# Patient Record
Sex: Male | Born: 1975 | Race: White | Hispanic: No | Marital: Married | Smoking: Current every day smoker
Health system: Southern US, Community
[De-identification: ages and names within clinical notes are randomized; demographics above are authoritative.]

## PROBLEM LIST (undated history)

## (undated) HISTORY — PX: CYST EXCISION: SHX5701

---

## 2010-08-13 ENCOUNTER — Emergency Department (HOSPITAL_COMMUNITY)
Admission: EM | Admit: 2010-08-13 | Discharge: 2010-08-13 | Disposition: A | Discharge status: Home / Self Care | Payer: Self-pay | Attending: Emergency Medicine | Admitting: Emergency Medicine

## 2010-08-13 ENCOUNTER — Emergency Department (HOSPITAL_COMMUNITY): Payer: Self-pay

## 2010-08-13 DIAGNOSIS — S61209A Unspecified open wound of unspecified finger without damage to nail, initial encounter: Secondary | ICD-10-CM | POA: Insufficient documentation

## 2010-08-13 DIAGNOSIS — Z23 Encounter for immunization: Secondary | ICD-10-CM | POA: Insufficient documentation

## 2010-08-13 DIAGNOSIS — W268XXA Contact with other sharp object(s), not elsewhere classified, initial encounter: Secondary | ICD-10-CM | POA: Insufficient documentation

## 2011-10-21 IMAGING — CR DG FINGER RING 2+V*L*
3 series · 3 of 3 positions shown · non-contrast
Comparison: None

CLINICAL DATA: Laceration left ring finger, bleeding, swelling

LEFT RING FINGER 2+V

[x finger pa left]
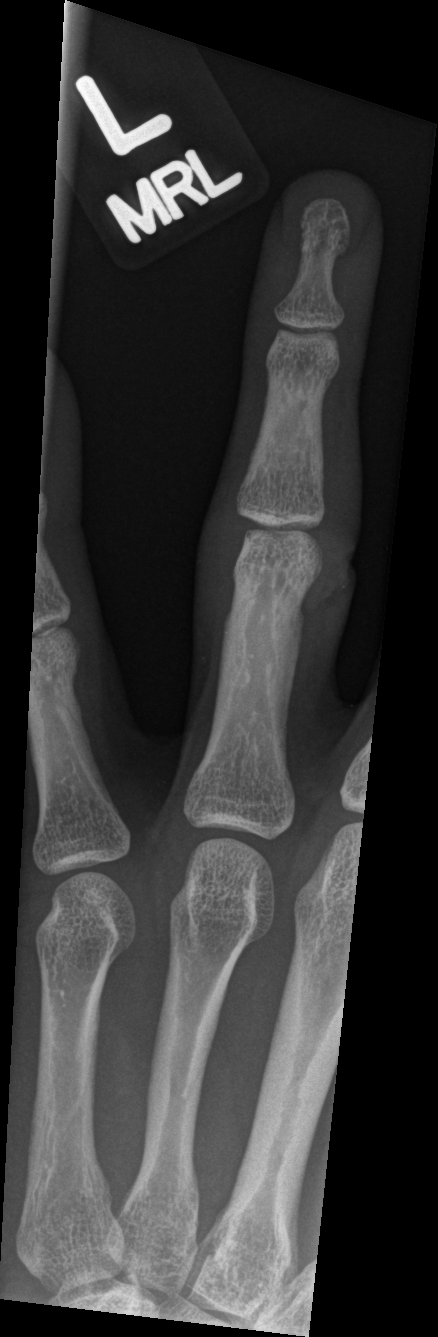

[x finger obl. left]
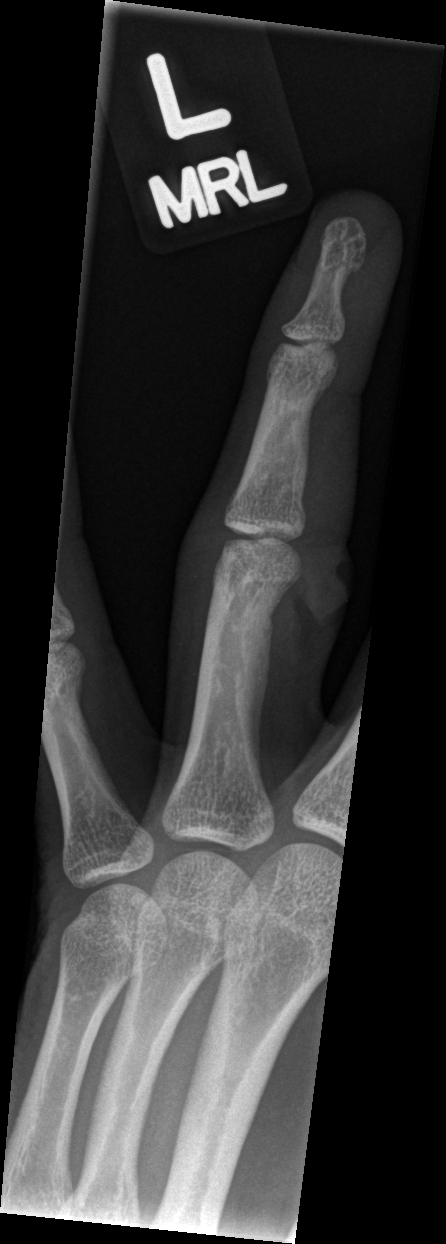

[x finger lateral left]
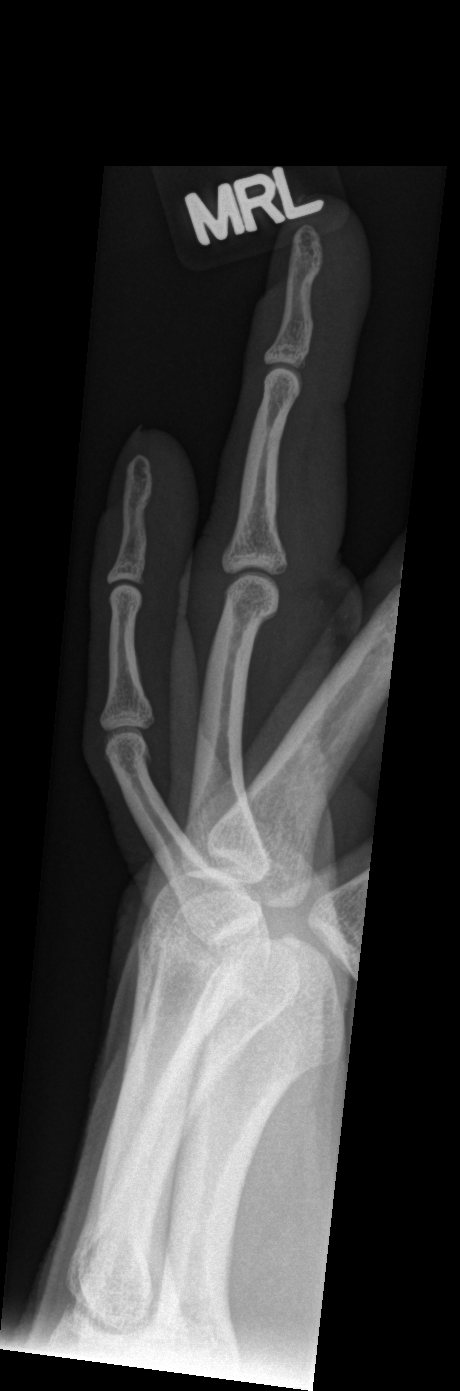

[3 of 3 positions shown; findings below may reference images not displayed]

FINDINGS: Soft tissue swelling and irregularity at radial aspect left ring
finger at the level of the head of the proximal phalanx.
Osseous mineralization normal.
Joint spaces preserved.
Tiny radiopacity at site of irregular soft tissue, question
radiopaque foreign body.
No acute fracture, dislocation or bone destruction.
IMPRESSION: No acute osseous abnormalities.
Questionable tiny radiopaque foreign body at site of laceration at
left ring finger.

## 2013-03-05 ENCOUNTER — Encounter (HOSPITAL_COMMUNITY): Payer: Self-pay | Admitting: Emergency Medicine

## 2013-03-05 ENCOUNTER — Emergency Department (HOSPITAL_COMMUNITY)
Admission: EM | Admit: 2013-03-05 | Discharge: 2013-03-05 | Disposition: A | Discharge status: Home / Self Care | Payer: Self-pay | Attending: Emergency Medicine | Admitting: Emergency Medicine

## 2013-03-05 DIAGNOSIS — Y9389 Activity, other specified: Secondary | ICD-10-CM | POA: Insufficient documentation

## 2013-03-05 DIAGNOSIS — W268XXA Contact with other sharp object(s), not elsewhere classified, initial encounter: Secondary | ICD-10-CM | POA: Insufficient documentation

## 2013-03-05 DIAGNOSIS — S61209A Unspecified open wound of unspecified finger without damage to nail, initial encounter: Secondary | ICD-10-CM | POA: Insufficient documentation

## 2013-03-05 DIAGNOSIS — Z23 Encounter for immunization: Secondary | ICD-10-CM | POA: Insufficient documentation

## 2013-03-05 DIAGNOSIS — Y9289 Other specified places as the place of occurrence of the external cause: Secondary | ICD-10-CM | POA: Insufficient documentation

## 2013-03-05 DIAGNOSIS — S61219A Laceration without foreign body of unspecified finger without damage to nail, initial encounter: Secondary | ICD-10-CM

## 2013-03-05 MED ORDER — TETANUS-DIPHTH-ACELL PERTUSSIS 5-2.5-18.5 LF-MCG/0.5 IM SUSP
0.5000 mL | Freq: Once | INTRAMUSCULAR | Status: AC
  Administered 2013-03-05: 0.5 mL via INTRAMUSCULAR
  Filled 2013-03-05: qty 0.5

## 2013-03-05 NOTE — ED Provider Notes (Signed)
CSN: [id number]     Arrival date & time 03/05/13  2106 History  This chart was scribed for non-physician practitioner, Kyung Bacca, PA-C working with Juliet Rude. Rubin Payor, MD by Greggory Stallion, ED scribe. This patient was seen in room WTR6/WTR6 and the patient's care was started at 9:25 PM.   Chief Complaint  Patient presents with  . Extremity Laceration   The history is provided by the patient. No language interpreter was used.   HPI Comments: Logan Little is a 37 y.o. male who presents to the Emergency Department complaining of left ring finger laceration that occurred about one hour ago. He states he cut his finger on a piece of glass on broken bottle in the garbage can. Denies associated finger weakness or numbness. Pt is unsure of his last tetanus.   History reviewed. No pertinent past medical history. History reviewed. No pertinent past surgical history. No family history on file. History  Substance Use Topics  . Smoking status: Not on file  . Smokeless tobacco: Not on file  . Alcohol Use: Not on file    Review of Systems  Skin: Positive for wound.  Neurological: Negative for weakness and numbness.  All other systems reviewed and are negative.    Allergies  Review of patient's allergies indicates not on file.  Home Medications  No current outpatient prescriptions on file.  BP 114/82  Pulse 99  Temp(Src) 98.3 F (36.8 C) (Oral)  Resp 16  SpO2 96%  Physical Exam  Nursing note and vitals reviewed. Constitutional: He is oriented to person, place, and time. He appears well-developed and well-nourished. No distress.  HENT:  Head: Normocephalic and atraumatic.  Eyes:  Normal appearance  Neck: Normal range of motion.  Pulmonary/Chest: Effort normal.  Musculoskeletal: Normal range of motion.  2cm vertical, bleeding, subq lac over dorsal surface of middle phalanx and over DIP of L ring finger.  ROM of DIP joint mildly limited by pain.  Brisk cap refill and  distal sensation intact.  Neurological: He is alert and oriented to person, place, and time.  Psychiatric: He has a normal mood and affect. His behavior is normal.    ED Course  Procedures (including critical care time)  DIAGNOSTIC STUDIES: Oxygen Saturation is 96% on RA, normal by my interpretation.    COORDINATION OF CARE: 9:28 PM-Discussed treatment plan which includes laceration repair with pt at bedside and pt agreed to plan.   LACERATION REPAIR Performed by: Otilio Miu Authorized by: Ruby Cola E Consent: Verbal consent obtained. Risks and benefits: risks, benefits and alternatives were discussed Consent given by: patient Patient identity confirmed: provided demographic data Prepped and Draped in normal sterile fashion Wound explored  Laceration Location: L ring finger  Laceration Length: 2.0 cm  No Foreign Bodies seen or palpated  Anesthesia: digital block Local anesthetic: lidocaine 2% w/out epinephrine  Anesthetic total: 5 ml  Irrigation method: syringe Amount of cleaning: standard  Skin closure: prolene 4.0  Number of sutures: 5  Technique: simple interrupted  Patient tolerance: Patient tolerated the procedure well with no immediate complications.   Labs Review Labs Reviewed - No data to display Imaging Review No results found.  EKG Interpretation   None       MDM   1. Laceration of finger, initial encounter    37yo healthy M presents w/ lac to L ring finger.  Cleaned and sutured.  Ortho tech placed in finger splint to prevent ROM of DIP joint. Tetanus updated.  Return  precautions discussed.    I personally performed the services described in this documentation, which was scribed in my presence. The recorded information has been reviewed and is accurate.   Otilio Miu, PA-C 03/06/13 0126

## 2013-03-05 NOTE — ED Notes (Signed)
Pt states he cut his ring finger on his L hand on a piece of glass on a garbage can. Bleeding controlled with pressure. Pt not sure when he last had a tetanus shot.

## 2013-03-07 NOTE — ED Provider Notes (Signed)
Medical screening examination/treatment/procedure(s) were performed by non-physician practitioner and as supervising physician I was immediately available for consultation/collaboration.  EKG Interpretation   None        Cleotilde Spadaccini R. Avedis Bevis, MD 03/07/13 2352 

## 2016-06-02 ENCOUNTER — Emergency Department
Admission: EM | Admit: 2016-06-02 | Discharge: 2016-06-02 | Disposition: A | Discharge status: Home / Self Care | Payer: Self-pay | Attending: Emergency Medicine | Admitting: Emergency Medicine

## 2016-06-02 ENCOUNTER — Emergency Department: Payer: Self-pay

## 2016-06-02 ENCOUNTER — Encounter: Payer: Self-pay | Admitting: Emergency Medicine

## 2016-06-02 DIAGNOSIS — R079 Chest pain, unspecified: Secondary | ICD-10-CM

## 2016-06-02 DIAGNOSIS — F17200 Nicotine dependence, unspecified, uncomplicated: Secondary | ICD-10-CM | POA: Insufficient documentation

## 2016-06-02 DIAGNOSIS — R0789 Other chest pain: Secondary | ICD-10-CM | POA: Insufficient documentation

## 2016-06-02 LAB — COMPREHENSIVE METABOLIC PANEL
ALT: 17 U/L (ref 17–63)
ANION GAP: 6 (ref 5–15)
AST: 18 U/L (ref 15–41)
Albumin: 4.3 g/dL (ref 3.5–5.0)
Alkaline Phosphatase: 52 U/L (ref 38–126)
BILIRUBIN TOTAL: 0.4 mg/dL (ref 0.3–1.2)
BUN: 15 mg/dL (ref 6–20)
CHLORIDE: 105 mmol/L (ref 101–111)
CO2: 26 mmol/L (ref 22–32)
Calcium: 8.8 mg/dL — ABNORMAL LOW (ref 8.9–10.3)
Creatinine, Ser: 0.87 mg/dL (ref 0.61–1.24)
Glucose, Bld: 106 mg/dL — ABNORMAL HIGH (ref 65–99)
POTASSIUM: 3.3 mmol/L — AB (ref 3.5–5.1)
Sodium: 137 mmol/L (ref 135–145)
TOTAL PROTEIN: 7 g/dL (ref 6.5–8.1)

## 2016-06-02 LAB — CBC
HEMATOCRIT: 44.1 % (ref 40.0–52.0)
Hemoglobin: 14.8 g/dL (ref 13.0–18.0)
MCH: 30.2 pg (ref 26.0–34.0)
MCHC: 33.6 g/dL (ref 32.0–36.0)
MCV: 89.9 fL (ref 80.0–100.0)
Platelets: 238 10*3/uL (ref 150–440)
RBC: 4.9 MIL/uL (ref 4.40–5.90)
RDW: 13.8 % (ref 11.5–14.5)
WBC: 8.6 10*3/uL (ref 3.8–10.6)

## 2016-06-02 LAB — LIPASE, BLOOD: LIPASE: 52 U/L — AB (ref 11–51)

## 2016-06-02 LAB — TROPONIN I: Troponin I: <0.03 ng/mL (ref <0.03)

## 2016-06-02 NOTE — ED Triage Notes (Signed)
Pt c/o LUQ pain present for 1 month. Denies NVD.  Pt supposed to go out of town tomorrow and wife wants to make sure he is ok before leaves.  Also c/o cyst to left rib area.  Denies CP/SHOB.  Denies fevers. When leans on a machine seems to make worse.

## 2016-06-02 NOTE — ED Notes (Signed)
Pt alert and oriented X4, active, cooperative, pt in NAD. RR even and unlabored, color WNL.  Pt informed to return if any life threatening symptoms occur.   

## 2016-06-02 NOTE — ED Provider Notes (Signed)
Time Seen: Approximately 1724*  I have reviewed the triage notes  Chief Complaint: Abdominal Pain   History of Present Illness: Logan Little is a 41 y.o. male who presents with some pain primarily in the left upper quadrant but states it's really in the rib cage on both sides. 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 He's had some surgically resected with pathology checks which did not show any signs of lymphoma etc. The patient's wife was concerned because her has been some non-Hodgkin's lymphoma in the family. He states the pains in his chest seems to be exacerbated by leaning against these lipomas. He denies any chest pain or shortness of breath.  History reviewed. No pertinent past medical history.  There are no active problems to display for this patient.   Past Surgical History:  Procedure Laterality Date  . CYST EXCISION      Past Surgical History:  Procedure Laterality Date  . CYST EXCISION        Allergies:  Patient has no known allergies.  Family History: History reviewed. No pertinent family history.  Social History: Social History  Substance Use Topics  . Smoking status: Current Every Day Smoker  . Smokeless tobacco: Never Used  . Alcohol use No     Review of Systems:   10 point review of systems was performed and was otherwise negative:  Constitutional: No fever Eyes: No visual disturbances ENT: No sore throat, ear pain Cardiac: No chest pain Respiratory: No shortness of breath, wheezing, or stridor Abdomen: Patient points mainly to the left upper quadrant over the left chest wall region. No nausea vomiting or diarrhea Endocrine: No weight loss, No night sweats Extremities: No peripheral edema, cyanosis Skin: No rashes, easy bruising Neurologic: No focal weakness, trouble with speech or swollowing Urologic: No dysuria, Hematuria, or urinary frequency   Physical Exam:  ED Triage Vitals  Enc Vitals Group     BP  06/02/16 1701 136/74     Pulse Rate 06/02/16 1701 78     Resp 06/02/16 1701 16     Temp 06/02/16 1701 98.6 F (37 C)     Temp Source 06/02/16 1701 Oral     SpO2 06/02/16 1701 98 %     Weight 06/02/16 1658 148 lb (67.1 kg)     Height 06/02/16 1658 5\' 7"  (1.702 m)     Head Circumference --      Peak Flow --      Pain Score 06/02/16 1658 5     Pain Loc --      Pain Edu? --      Excl. in GC? --     General: Awake , Alert , and Oriented times 3; GCS 15 Head: Normal cephalic , atraumatic Eyes: Pupils equal , round, reactive to light Nose/Throat: No nasal drainage, patent upper airway without erythema or exudate.  Neck: Supple, Full range of motion, No anterior adenopathy or palpable thyroid masses Lungs: Clear to ascultation without wheezes , rhonchi, or rales Heart: Regular rate, regular rhythm without murmurs , gallops , or rubs Abdomen: Soft, non tender without rebound, guarding , or rigidity; bowel sounds positive and symmetric in all 4 quadrants. No organomegaly .        Extremities: 2 plus symmetric pulses. No edema, clubbing or cyanosis Neurologic: normal ambulation, Motor symmetric without deficits, sensory intact Skin: warm, dry, no rashes Patient's tender mainly on the undersurface anteriorly of both rib cage regions.  I do not palpate any abnormal masses. There is a lipoma that somewhat tender across the lower left chest wall region approximately 2 cm in size without erythema etc.  Labs:   All laboratory work was reviewed including any pertinent negatives or positives listed below:  Labs Reviewed  LIPASE, BLOOD - Abnormal; Notable for the following:       Result Value   Lipase 52 (*)    All other components within normal limits  COMPREHENSIVE METABOLIC PANEL - Abnormal; Notable for the following:    Potassium 3.3 (*)    Glucose, Bld 106 (*)    Calcium 8.8 (*)    All other components within normal limits  CBC  TROPONIN I  URINALYSIS, COMPLETE (UACMP) WITH MICROSCOPIC     EKG:  ED ECG REPORT I, Jennye MoccasinBrian S Quigley, the attending physician, personally viewed and interpreted this ECG.  Date: 06/02/2016 EKG Time: 1705 Rate: 79 Rhythm: normal sinus rhythm QRS Axis: Left axis deviation Intervals: Incomplete left bundle branch block ST/T Wave abnormalities: normal Conduction Disturbances: none Narrative Interpretation: unremarkable No acute ischemic changes are noted  Radiology:  "Dg Chest 2 View  Result Date: 06/02/2016 CLINICAL DATA:  Smoker with left lower chest/ LUQ abd pain for about one month. Denies heart/lung disease EXAM: CHEST  2 VIEW COMPARISON:  None. FINDINGS: The heart size and mediastinal contours are within normal limits. Both lungs are clear. No pleural effusion or pneumothorax. The visualized skeletal structures are unremarkable. IMPRESSION: No active cardiopulmonary disease. Electronically Signed   By: Amie Portlandavid  Ormond M.D.   On: 06/02/2016 17:50  "  I personally reviewed the radiologic studies   ED Course: Given the patient's current clinical presentation and objective findings I felt this was unlikely to be a life-threatening cause for his discomfort especially based on his history. The main concerns seem to be the possibility of lymphoma. Advised them that the chest x-ray and exam does not indicate this and with his history of lipomas of felt it was unlikely but do recommend further outpatient follow-up. He states he does have a scheduled appointment with a surgeon for resection and evaluation of lipomas. He does have a bundle branch block on his EKG but certainly no acute ischemic changes and his history does not point to acute coronary syndrome nor other life-threatening causes for chest pain     Assessment: * Acute unspecified chest pain Chest wall discomfort  Final Clinical Impression:   Final diagnoses:  Chest pain, unspecified type     Plan:  Outpatient Patient was advised to return immediately if condition worsens.  Patient was advised to follow up with their primary care physician or other specialized physicians involved in their outpatient care. The patient and/or family member/power of attorney had laboratory results reviewed at the bedside. All questions and concerns were addressed and appropriate discharge instructions were distributed by the nursing staff. Jennye Moccasin*            Brian S Quigley, MD 06/02/16 2034

## 2016-06-02 NOTE — Discharge Instructions (Signed)
Please return immediately if condition worsens. Please contact her primary physician or the physician you were given for referral. If you have any specialist physicians involved in her treatment and plan please also contact them. Thank you for using [facility]. ° °

## 2017-08-10 IMAGING — CR DG CHEST 2V
2 series · 2 of 2 positions shown · non-contrast
Comparison: None.

CLINICAL DATA: Smoker with left lower chest/ LUQ abd pain for about
one month. Denies heart/lung disease

EXAM:
CHEST  2 VIEW

[chest pa]
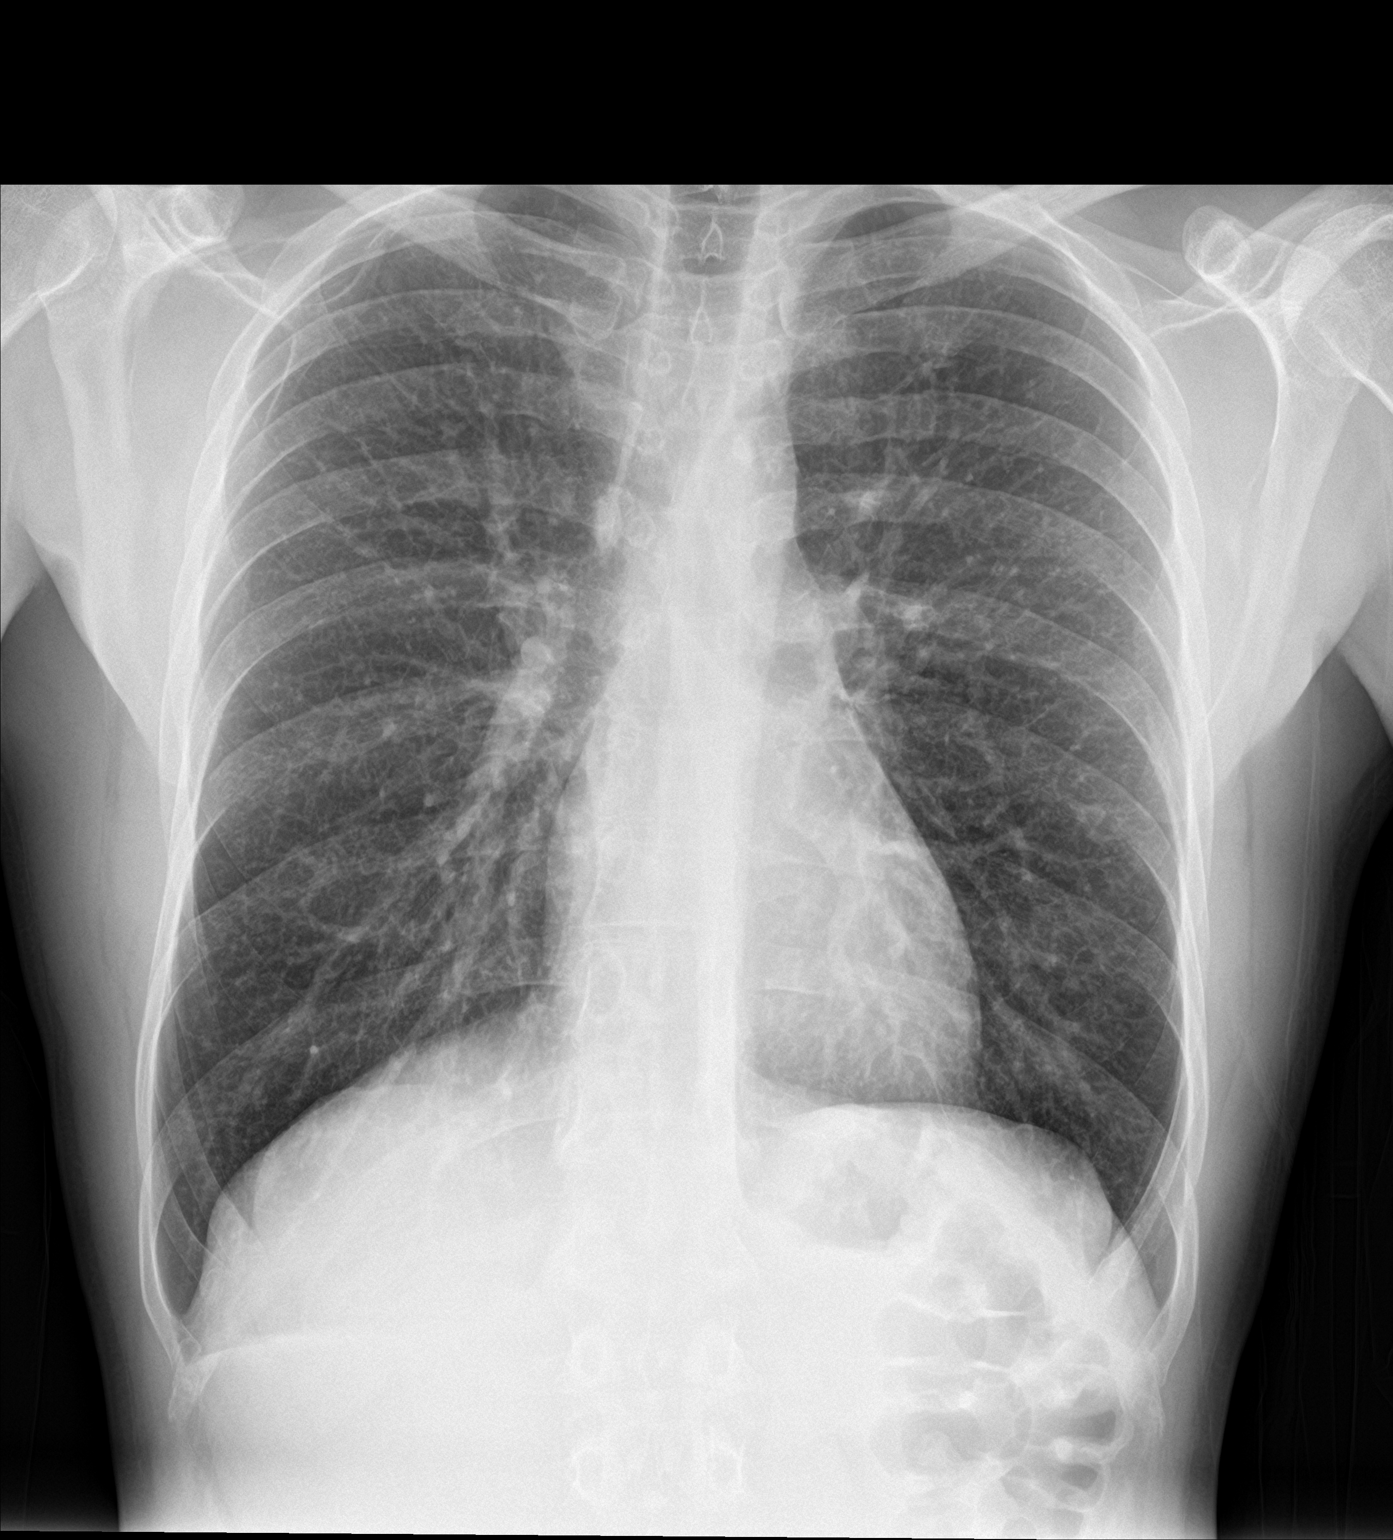

[chest lat]
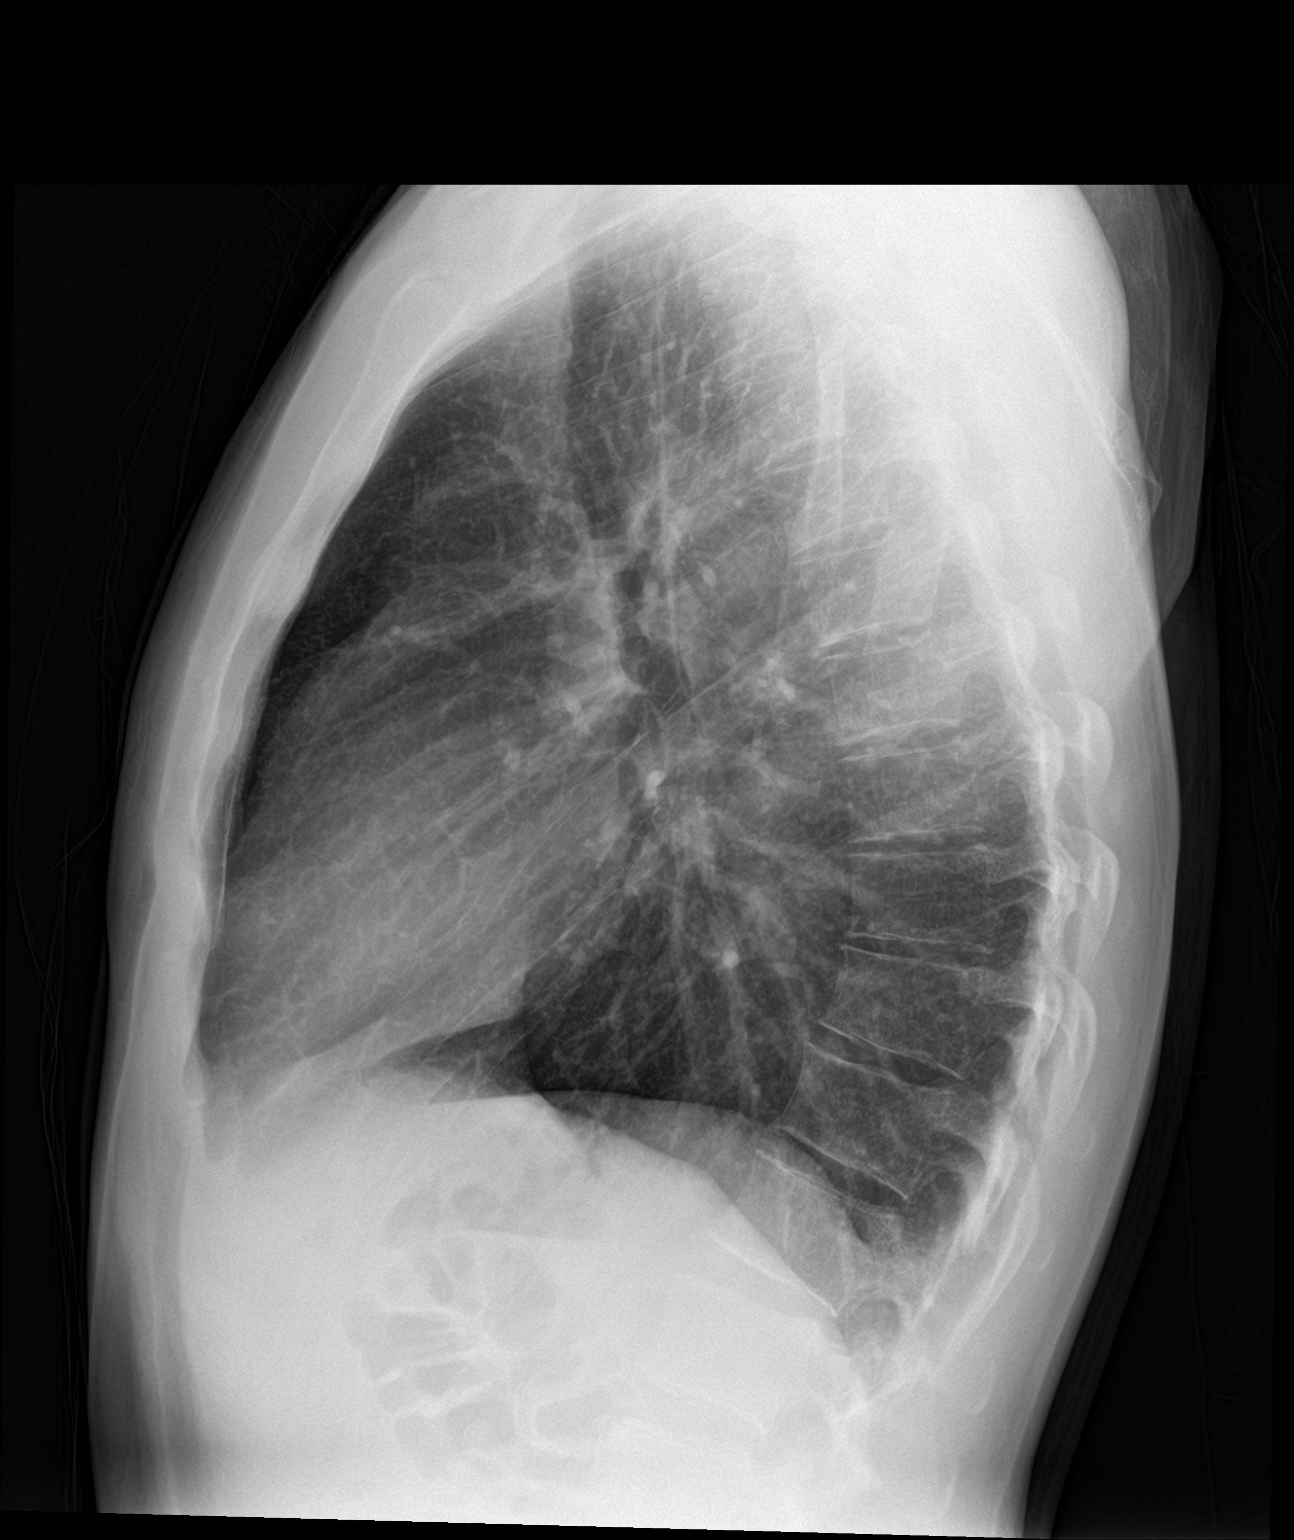

[2 of 2 positions shown; findings below may reference images not displayed]

FINDINGS: The heart size and mediastinal contours are within normal limits.
Both lungs are clear. No pleural effusion or pneumothorax. The
visualized skeletal structures are unremarkable.
IMPRESSION: No active cardiopulmonary disease.

## 2018-10-16 ENCOUNTER — Encounter: Payer: Self-pay | Admitting: Emergency Medicine

## 2018-10-16 ENCOUNTER — Other Ambulatory Visit: Payer: Self-pay

## 2018-10-16 ENCOUNTER — Ambulatory Visit
Admission: EM | Admit: 2018-10-16 | Discharge: 2018-10-16 | Disposition: A | Discharge status: Home / Self Care | Payer: Self-pay | Attending: Physician Assistant | Admitting: Physician Assistant

## 2018-10-16 DIAGNOSIS — R1013 Epigastric pain: Secondary | ICD-10-CM

## 2018-10-16 LAB — COMPREHENSIVE METABOLIC PANEL
ALT: 41 IU/L (ref 0–44)
AST: 22 IU/L (ref 0–40)
Albumin/Globulin Ratio: 2.2 (ref 1.2–2.2)
Albumin: 4.9 g/dL (ref 4.0–5.0)
Alkaline Phosphatase: 59 IU/L (ref 39–117)
BUN/Creatinine Ratio: 16 (ref 9–20)
BUN: 16 mg/dL (ref 6–24)
Bilirubin Total: 0.6 mg/dL (ref 0.0–1.2)
CO2: 29 mmol/L (ref 20–29)
Calcium: 11 mg/dL — ABNORMAL HIGH (ref 8.7–10.2)
Chloride: 102 mmol/L (ref 96–106)
Creatinine, Ser: 1.01 mg/dL (ref 0.76–1.27)
GFR calc Af Amer: 105 mL/min/{1.73_m2} (ref >59)
GFR calc non Af Amer: 91 mL/min/{1.73_m2} (ref >59)
Globulin, Total: 2.2 g/dL (ref 1.5–4.5)
Glucose: 107 mg/dL — ABNORMAL HIGH (ref 65–99)
Potassium: 4 mmol/L (ref 3.5–5.2)
Sodium: 136 mmol/L (ref 134–144)
Total Protein: 7.1 g/dL (ref 6.0–8.5)

## 2018-10-16 LAB — LIPASE: Lipase: 25 U/L (ref 13–78)

## 2018-10-16 MED ORDER — OMEPRAZOLE 20 MG PO CPDR: 20.0000 mg | DELAYED_RELEASE_CAPSULE | Freq: Every day | ORAL | 0 refills | Status: DC

## 2018-10-16 MED ORDER — ONDANSETRON 4 MG PO TBDP: 4.0000 mg | ORAL_TABLET | Freq: Three times a day (TID) | ORAL | 0 refills | Status: DC | PRN

## 2018-10-16 MED ORDER — ALUM & MAG HYDROXIDE-SIMETH 200-200-20 MG/5ML PO SUSP
30.0000 mL | Freq: Once | ORAL | Status: AC
  Administered 2018-10-16: 30 mL via ORAL

## 2018-10-16 MED ORDER — LIDOCAINE VISCOUS HCL 2 % MT SOLN
15.0000 mL | Freq: Once | OROMUCOSAL | Status: AC
  Administered 2018-10-16: 15:00:00 15 mL via ORAL

## 2018-10-16 NOTE — ED Triage Notes (Addendum)
Pt presents to [facility] for assessment of epigastric pain starting Tuesday after having dental surgery on Monday and placed on vicodin and amoxicillin.  Patient states starting Tuesday morning he began having epigastric pain that radiates up into his chest with nausea with attempts to eat.  Patient states he's been working through it.  Patient states it is a burning, twisting sensation.  Patient denies diarrhea, states he has had 1 BM since he started the meds, and it was normal.  Patient states he stopped taking both medications Wednesday because of the uncomfortableness.  Patient states he was taking a lot of ibuprofen and tylenol prior to his dental surgery.    Pt adds that he has a coworker who was exposed to someone who was tested for COVID.  States he believes the test was negative, but that he had concerning symptoms.  He has concerns for that being the cause.

## 2018-10-16 NOTE — ED Provider Notes (Signed)
[facility]    CSN: [id number] Arrival date & time: 10/16/18  1359     History   Chief Complaint Chief Complaint  Patient presents with  . Abdominal Pain    HPI Logan Little is a 43 y.o. male.   43 year old male comes in for 2-day history of epigastric pain.  Patient had dental surgery 3 days ago, and was put on Vicodin and amoxicillin.  He took 1-2 dose of each when he started having epigastric pain that is stabbing/cramping in sensation.  He has nausea with few episodes of belching causing burning sensation up the chest.  Had one episode of bowel movement without straining.  Has decreased appetite.  Has tolerated bland diet.  Denies fever, chills, night sweats.  Denies URI symptoms such as cough, congestion, sore throat.  Current everyday smoker.  Had taken ibuprofen/Tylenol every few hours prior to dental surgery, otherwise no chronic NSAID use.  No recent alcohol use.  No history of alcohol abuse.  Denies drug use.     History reviewed. No pertinent past medical history.  There are no active problems to display for this patient.   Past Surgical History:  Procedure Laterality Date  . CYST EXCISION       Home Medications    Prior to Admission medications   Medication Sig Start Date End Date Taking? Authorizing Provider  omeprazole (PRILOSEC) 20 MG capsule Take 1 capsule (20 mg total) by mouth daily. 10/16/18   Cathie HoopsYu, Mainor Hellmann V, PA-C  ondansetron (ZOFRAN ODT) 4 MG disintegrating tablet Take 1 tablet (4 mg total) by mouth every 8 (eight) hours as needed for nausea or vomiting. 10/16/18   Belinda FisherYu, Sakiya Stepka V, PA-C    Family History History reviewed. No pertinent family history.  Social History Social History   Tobacco Use  . Smoking status: Current Every Day Smoker    Packs/day: 1.00  . Smokeless tobacco: Never Used  Substance Use Topics  . Alcohol use: Yes    Comment: ocasionally  . Drug use: Yes    Types: Marijuana     Allergies   Patient has no known  allergies.   Review of Systems Review of Systems  Reason unable to perform ROS: See HPI as above.     Physical Exam Triage Vital Signs ED Triage Vitals  Enc Vitals Group     BP 10/16/18 1411 124/78     Pulse Rate 10/16/18 1411 76     Resp 10/16/18 1411 20     Temp 10/16/18 1411 99.1 F (37.3 C)     Temp Source 10/16/18 1411 Oral     SpO2 10/16/18 1411 98 %     Weight --      Height --      Head Circumference --      Peak Flow --      Pain Score 10/16/18 1421 6     Pain Loc --      Pain Edu? --      Excl. in GC? --    No data found.  Updated Vital Signs BP 124/78 (BP Location: Left Arm)   Pulse 76   Temp 99.1 F (37.3 C) (Oral)   Resp 20   SpO2 98%   Physical Exam Constitutional:      General: He is not in acute distress.    Appearance: He is well-developed. He is not ill-appearing, toxic-appearing or diaphoretic.  HENT:     Head: Normocephalic and atraumatic.  Cardiovascular:  Rate and Rhythm: Normal rate and regular rhythm.     Heart sounds: Normal heart sounds. No murmur. No friction rub. No gallop.   Pulmonary:     Effort: Pulmonary effort is normal.     Breath sounds: Normal breath sounds. No wheezing or rales.  Abdominal:     General: Bowel sounds are normal.     Palpations: Abdomen is soft.     Tenderness: There is abdominal tenderness in the epigastric area. There is no right CVA tenderness, left CVA tenderness, guarding or rebound. Negative signs include Murphy's sign.  Skin:    General: Skin is warm and dry.  Neurological:     Mental Status: He is alert and oriented to person, place, and time.  Psychiatric:        Behavior: Behavior normal.        Judgment: Judgment normal.    UC Treatments / Results  Labs (all labs ordered are listed, but only abnormal results are displayed) Labs Reviewed  COMPREHENSIVE METABOLIC PANEL - Abnormal; Notable for the following components:      Result Value   Glucose 107 (*)    Calcium 11.0 (*)    All  other components within normal limits   Narrative:    Performed at:  [facility], [place], [place]  [id number] Lab Director: Lindon Romp MD, Phone:  [phone]  LIPASE   Narrative:    Performed at:  [facility], [place], [place]  [id number] Lab Director: Lindon Romp MD, Phone:  [phone]    EKG   Radiology No results found.  Procedures Procedures (including critical care time)  Medications Ordered in UC Medications  alum & mag hydroxide-simeth (MAALOX/MYLANTA) 200-200-20 MG/5ML suspension 30 mL (30 mLs Oral Given 10/16/18 1454)    And  lidocaine (XYLOCAINE) 2 % viscous mouth solution 15 mL (15 mLs Oral Given 10/16/18 1454)    Initial Impression / Assessment and Plan / UC Course  I have reviewed the triage vital signs and the nursing notes.  Pertinent labs & imaging results that were available during my care of the patient were reviewed by me and considered in my medical decision making (see chart for details).    Will draw CMP and lipase for further evaluation.  GI cocktail given in office with mild relief.  Discussed possible gastritis caused by NSAIDs, antibiotic use.  Omeprazole and Zofran provided.  Push fluids.  Return precautions given.  Patient expresses understanding and agrees to plan.  Called patient about lab results, no significant changes. Will continue continue current plan and monitor symptoms. Again went through return precautions. Patient expresses understanding and agrees to plan.  Final Clinical Impressions(s) / UC Diagnoses   Final diagnoses:  Abdominal pain, epigastric    ED Prescriptions    Medication Sig Dispense Auth. Provider   omeprazole (PRILOSEC) 20 MG capsule Take 1 capsule (20 mg total) by mouth daily. 15 capsule Tanay Massiah V, PA-C   ondansetron (ZOFRAN ODT) 4 MG disintegrating tablet Take 1 tablet (4 mg total) by mouth every 8 (eight) hours as needed  for nausea or vomiting. 15 tablet Tobin Chad, [place] 10/16/18 1654

## 2018-10-16 NOTE — Discharge Instructions (Signed)
GI cocktail given in office. Labs drawn to check your liver and pancreatic enzyme. Omeprazole as needed for acid reflux. Zofran as needed for nausea/vomiting. Keep hydrated, urine should be clear to pale yellow in color. Bland diet, advance as tolerated. If experiencing worsening symptoms, unable to jump up and down due to pain, nausea/vomiting not controlled by medication, go to the ED for further evaluation needed.  As discussed, call your dentist to see if you still need to be on an antibiotic for the procedure.

## 2018-10-16 NOTE — ED Notes (Signed)
Patient able to ambulate independently  

## 2018-10-17 ENCOUNTER — Emergency Department
Admission: EM | Admit: 2018-10-17 | Discharge: 2018-10-17 | Disposition: A | Discharge status: Home / Self Care | Payer: Self-pay | Attending: Emergency Medicine | Admitting: Emergency Medicine

## 2018-10-17 ENCOUNTER — Encounter: Payer: Self-pay | Admitting: Emergency Medicine

## 2018-10-17 ENCOUNTER — Other Ambulatory Visit: Payer: Self-pay

## 2018-10-17 DIAGNOSIS — F17210 Nicotine dependence, cigarettes, uncomplicated: Secondary | ICD-10-CM | POA: Insufficient documentation

## 2018-10-17 DIAGNOSIS — K2970 Gastritis, unspecified, without bleeding: Secondary | ICD-10-CM | POA: Insufficient documentation

## 2018-10-17 DIAGNOSIS — R112 Nausea with vomiting, unspecified: Secondary | ICD-10-CM | POA: Insufficient documentation

## 2018-10-17 LAB — CBC
HCT: 48.1 % (ref 39.0–52.0)
Hemoglobin: 16.5 g/dL (ref 13.0–17.0)
MCH: 30.1 pg (ref 26.0–34.0)
MCHC: 34.3 g/dL (ref 30.0–36.0)
MCV: 87.8 fL (ref 80.0–100.0)
Platelets: 297 10*3/uL (ref 150–400)
RBC: 5.48 MIL/uL (ref 4.22–5.81)
RDW: 13.2 % (ref 11.5–15.5)
WBC: 10.2 10*3/uL (ref 4.0–10.5)
nRBC: 0 % (ref 0.0–0.2)

## 2018-10-17 LAB — COMPREHENSIVE METABOLIC PANEL
ALT: 39 U/L (ref 0–44)
AST: 21 U/L (ref 15–41)
Albumin: 4.9 g/dL (ref 3.5–5.0)
Alkaline Phosphatase: 54 U/L (ref 38–126)
Anion gap: 16 — ABNORMAL HIGH (ref 5–15)
BUN: 22 mg/dL — ABNORMAL HIGH (ref 6–20)
CO2: 24 mmol/L (ref 22–32)
Calcium: 9.8 mg/dL (ref 8.9–10.3)
Chloride: 100 mmol/L (ref 98–111)
Creatinine, Ser: 0.97 mg/dL (ref 0.61–1.24)
GFR calc Af Amer: >60 mL/min (ref >60)
GFR calc non Af Amer: >60 mL/min (ref >60)
Glucose, Bld: 116 mg/dL — ABNORMAL HIGH (ref 70–99)
Potassium: 3.2 mmol/L — ABNORMAL LOW (ref 3.5–5.1)
Sodium: 140 mmol/L (ref 135–145)
Total Bilirubin: 1.3 mg/dL — ABNORMAL HIGH (ref 0.3–1.2)
Total Protein: 8.2 g/dL — ABNORMAL HIGH (ref 6.5–8.1)

## 2018-10-17 LAB — TROPONIN I (HIGH SENSITIVITY): Troponin I (High Sensitivity): 3 ng/L (ref <18)

## 2018-10-17 LAB — LIPASE, BLOOD: Lipase: 32 U/L (ref 11–51)

## 2018-10-17 MED ORDER — ONDANSETRON HCL 4 MG/2ML IJ SOLN
4.0000 mg | Freq: Once | INTRAMUSCULAR | Status: AC
Start: 2018-10-17 — End: 2018-10-17
  Administered 2018-10-17: 4 mg via INTRAVENOUS

## 2018-10-17 MED ORDER — PANTOPRAZOLE SODIUM 40 MG PO TBEC: 40.0000 mg | DELAYED_RELEASE_TABLET | Freq: Every day | ORAL | 1 refills | Status: DC

## 2018-10-17 MED ORDER — PROMETHAZINE HCL 25 MG/ML IJ SOLN
25.0000 mg | Freq: Once | INTRAMUSCULAR | Status: AC
  Administered 2018-10-17: 25 mg via INTRAVENOUS
  Filled 2018-10-17: qty 1

## 2018-10-17 MED ORDER — ONDANSETRON HCL 4 MG/2ML IJ SOLN
INTRAMUSCULAR | Status: AC
  Administered 2018-10-17: 4 mg via INTRAVENOUS
  Filled 2018-10-17: qty 2

## 2018-10-17 MED ORDER — SODIUM CHLORIDE 0.9 % IV BOLUS
1000.0000 mL | Freq: Once | INTRAVENOUS | Status: AC
  Administered 2018-10-17: 1000 mL via INTRAVENOUS

## 2018-10-17 MED ORDER — SODIUM CHLORIDE 0.9% FLUSH
3.0000 mL | Freq: Once | INTRAVENOUS | Status: AC
  Administered 2018-10-17: 3 mL via INTRAVENOUS

## 2018-10-17 MED ORDER — FENTANYL CITRATE (PF) 100 MCG/2ML IJ SOLN
50.0000 ug | Freq: Once | INTRAMUSCULAR | Status: AC
  Administered 2018-10-17: 50 ug via INTRAVENOUS
  Filled 2018-10-17: qty 2

## 2018-10-17 MED ORDER — PROMETHAZINE HCL 25 MG PO TABS: 25.0000 mg | ORAL_TABLET | Freq: Four times a day (QID) | ORAL | 0 refills | Status: DC | PRN

## 2018-10-17 NOTE — ED Provider Notes (Signed)
[facility] Emergency Department Provider Note  Time seen: 4:00 PM  I have reviewed the triage vital signs and the nursing notes.   HISTORY  Chief Complaint Abdominal Pain and Emesis   HPI Logan Little is a 43 y.o. male with no significant past medical history who presents to the emergency department for upper abdominal discomfort nausea vomiting.  According to the patient over the past 3 days he has been experiencing upper abdominal discomfort/burning as well as nausea and vomiting.  Patient states he has had a very painful abscessed tooth, was taking significant amounts of ibuprofen over the weekend saw his dentist on Monday, had repair of the tooth, discharged on pain medicine and antibiotics.  Patient states however since Tuesday or so he has been experiencing upper abdominal pain nausea and vomiting.  Stopped taking the pain medication, but continued to be nauseated.   History reviewed. No pertinent past medical history.  There are no active problems to display for this patient.   Past Surgical History:  Procedure Laterality Date  . CYST EXCISION      Prior to Admission medications   Medication Sig Start Date End Date Taking? Authorizing Provider  omeprazole (PRILOSEC) 20 MG capsule Take 1 capsule (20 mg total) by mouth daily. 10/16/18   Tasia Catchings, Amy V, PA-C  ondansetron (ZOFRAN ODT) 4 MG disintegrating tablet Take 1 tablet (4 mg total) by mouth every 8 (eight) hours as needed for nausea or vomiting. 10/16/18   Tasia Catchings, Amy V, PA-C    No Known Allergies  No family history on file.  Social History Social History   Tobacco Use  . Smoking status: Current Every Day Smoker    Packs/day: 1.00  . Smokeless tobacco: Never Used  Substance Use Topics  . Alcohol use: Yes    Comment: ocasionally  . Drug use: Yes    Types: Marijuana    Comment: 2-3 days ago    Review of Systems Constitutional: Negative for fever. Cardiovascular: Negative for chest  pain. Respiratory: Negative for shortness of breath. Gastrointestinal: Moderate upper abdominal discomfort/burning.  Positive for vomiting. Genitourinary: Negative for urinary compaints Musculoskeletal: Negative for musculoskeletal complaints Skin: Negative for skin complaints  Neurological: Negative for headache All other ROS negative  ____________________________________________   PHYSICAL EXAM:  VITAL SIGNS: ED Triage Vitals  Enc Vitals Group     BP 10/17/18 1443 125/75     Pulse Rate 10/17/18 1443 81     Resp 10/17/18 1443 18     Temp 10/17/18 1443 98.3 F (36.8 C)     Temp Source 10/17/18 1443 Oral     SpO2 10/17/18 1443 100 %     Weight 10/17/18 1459 153 lb (69.4 kg)     Height 10/17/18 1459 5\' 7"  (1.702 m)     Head Circumference --      Peak Flow --      Pain Score 10/17/18 1459 8     Pain Loc --      Pain Edu? --      Excl. in [place]? --    Constitutional: Alert and oriented. Well appearing and in no distress. Eyes: Normal exam ENT      Head: Normocephalic and atraumatic.      Mouth/Throat: Mucous membranes are moist. Cardiovascular: Normal rate, regular rhythm.  Respiratory: Normal respiratory effort without tachypnea nor retractions. Breath sounds are clear  Gastrointestinal: Soft, mild to moderate epigastric discomfort otherwise benign abdominal exam.  No rebound guarding or distention Musculoskeletal:  Nontender with normal range of motion in all extremities.  Neurologic:  Normal speech and language. No gross focal neurologic deficits Skin:  Skin is warm, dry and intact.  Psychiatric: Mood and affect are normal.   ____________________________________________    EKG  EKG viewed and interpreted by myself shows a normal sinus rhythm at 71 bpm with a narrow QRS, normal axis, normal intervals, nonspecific ST changes.  ____________________________________________   INITIAL IMPRESSION / ASSESSMENT AND PLAN / ED COURSE  Pertinent labs & imaging results that  were available during my care of the patient were reviewed by me and considered in my medical decision making (see chart for details).   Patient presents to the emergency department for upper abdominal pain nausea vomiting.  Patient was using ibuprofen products heavily over the weekend due to a painful tooth which has been since repaired by his dentist.  Differential would include gastroenteritis, gastritis, less likely ACS, pancreatitis or gallbladder pathology.  Overall the patient appears very well, states he is feeling better after Zofran.  States mild to moderate discomfort in the upper abdomen.  Was seen in urgent care yesterday and prescribed Prilosec and Zofran.  States he tried to take Zofran but vomited it back up.  Was only able to keep down 1 pill of Prilosec so far.  Patient states he is feeling much better after Zofran, IV fluids.  We will dose additional liter of IV fluids, we will dose Phenergan and a small dose of fentanyl for pain control.  Patient's lab work is largely within normal limits.  Patient does have an EKG with nonspecific ST changes there is a T wave inversion in V3, denies any chest pain or shortness of breath.  We will add on a troponin as a precaution.  Logan Little was evaluated in Emergency Department on 10/17/2018 for the symptoms described in the history of present illness. He was evaluated in the context of the global COVID-19 pandemic, which necessitated consideration that the patient might be at risk for infection with the SARS-CoV-2 virus that causes COVID-19. Institutional protocols and algorithms that pertain to the evaluation of patients at risk for COVID-19 are in a state of rapid change based on information released by regulatory bodies including the CDC and federal and state organizations. These policies and algorithms were followed during the patient's care in the ED.  ____________________________________________   FINAL CLINICAL IMPRESSION(S) / ED  DIAGNOSES  Epigastric pain Nausea vomiting   Minna AntisPaduchowski, Dewey Neukam, MD 10/17/18 1607

## 2018-10-17 NOTE — ED Triage Notes (Signed)
Pt to ED via POV c/o epigastric abdominal pain and vomiting since Tuesday night. Pt states that he is not having any diarrhea. Pt states that the is not able to keep any fluids down. Pt was seen at Urgent care yesterday an was told to come to the ED if he was not getting better. Pt states that he is still vomiting and feels weak. Pt actively vomiting in triage. Pt is in NAD.

## 2020-10-14 ENCOUNTER — Other Ambulatory Visit: Payer: Self-pay

## 2020-10-14 ENCOUNTER — Encounter (HOSPITAL_COMMUNITY): Payer: Self-pay | Admitting: Family Medicine

## 2020-10-14 ENCOUNTER — Emergency Department (HOSPITAL_COMMUNITY): Payer: 59

## 2020-10-14 ENCOUNTER — Inpatient Hospital Stay (HOSPITAL_COMMUNITY)
Admission: EM | Admit: 2020-10-14 | Discharge: 2020-10-16 | DRG: 640 | Disposition: A | Discharge status: Home / Self Care | Payer: 59 | Attending: Family Medicine | Admitting: Family Medicine

## 2020-10-14 DIAGNOSIS — E872 Acidosis: Secondary | ICD-10-CM | POA: Diagnosis present

## 2020-10-14 DIAGNOSIS — R7989 Other specified abnormal findings of blood chemistry: Secondary | ICD-10-CM | POA: Diagnosis present

## 2020-10-14 DIAGNOSIS — Z79899 Other long term (current) drug therapy: Secondary | ICD-10-CM

## 2020-10-14 DIAGNOSIS — R112 Nausea with vomiting, unspecified: Secondary | ICD-10-CM | POA: Diagnosis present

## 2020-10-14 DIAGNOSIS — E860 Dehydration: Secondary | ICD-10-CM | POA: Diagnosis not present

## 2020-10-14 DIAGNOSIS — E876 Hypokalemia: Secondary | ICD-10-CM | POA: Diagnosis present

## 2020-10-14 DIAGNOSIS — Z8616 Personal history of COVID-19: Secondary | ICD-10-CM

## 2020-10-14 DIAGNOSIS — Z720 Tobacco use: Secondary | ICD-10-CM | POA: Diagnosis present

## 2020-10-14 DIAGNOSIS — U071 COVID-19: Secondary | ICD-10-CM

## 2020-10-14 DIAGNOSIS — A0839 Other viral enteritis: Secondary | ICD-10-CM | POA: Diagnosis present

## 2020-10-14 DIAGNOSIS — N179 Acute kidney failure, unspecified: Secondary | ICD-10-CM

## 2020-10-14 DIAGNOSIS — Z28310 Unvaccinated for covid-19: Secondary | ICD-10-CM

## 2020-10-14 DIAGNOSIS — F17210 Nicotine dependence, cigarettes, uncomplicated: Secondary | ICD-10-CM | POA: Diagnosis present

## 2020-10-14 LAB — URINALYSIS, ROUTINE W REFLEX MICROSCOPIC
Bilirubin Urine: NEGATIVE
Glucose, UA: NEGATIVE mg/dL
Ketones, ur: NEGATIVE mg/dL
Leukocytes,Ua: NEGATIVE
Nitrite: NEGATIVE
Protein, ur: >=300 mg/dL — AB
Specific Gravity, Urine: 1.018 (ref 1.005–1.030)
pH: 8 (ref 5.0–8.0)

## 2020-10-14 LAB — MAGNESIUM: Magnesium: 2.6 mg/dL — ABNORMAL HIGH (ref 1.7–2.4)

## 2020-10-14 LAB — CBC WITH DIFFERENTIAL/PLATELET
Abs Immature Granulocytes: 0.03 10*3/uL (ref 0.00–0.07)
Basophils Absolute: 0 10*3/uL (ref 0.0–0.1)
Basophils Relative: 0 %
Eosinophils Absolute: 0 10*3/uL (ref 0.0–0.5)
Eosinophils Relative: 0 %
HCT: 54.3 % — ABNORMAL HIGH (ref 39.0–52.0)
Hemoglobin: 19 g/dL — ABNORMAL HIGH (ref 13.0–17.0)
Immature Granulocytes: 0 %
Lymphocytes Relative: 8 %
Lymphs Abs: 1.2 10*3/uL (ref 0.7–4.0)
MCH: 30.4 pg (ref 26.0–34.0)
MCHC: 35 g/dL (ref 30.0–36.0)
MCV: 86.7 fL (ref 80.0–100.0)
Monocytes Absolute: 0.9 10*3/uL (ref 0.1–1.0)
Monocytes Relative: 7 %
Neutro Abs: 11.6 10*3/uL — ABNORMAL HIGH (ref 1.7–7.7)
Neutrophils Relative %: 85 %
Platelets: 239 10*3/uL (ref 150–400)
RBC: 6.26 MIL/uL — ABNORMAL HIGH (ref 4.22–5.81)
RDW: 13.1 % (ref 11.5–15.5)
WBC: 13.7 10*3/uL — ABNORMAL HIGH (ref 4.0–10.5)
nRBC: 0 % (ref 0.0–0.2)

## 2020-10-14 LAB — BASIC METABOLIC PANEL
Anion gap: 11 (ref 5–15)
Anion gap: 9 (ref 5–15)
BUN: 43 mg/dL — ABNORMAL HIGH (ref 6–20)
BUN: 35 mg/dL — ABNORMAL HIGH (ref 6–20)
CO2: 33 mmol/L — ABNORMAL HIGH (ref 22–32)
CO2: 30 mmol/L (ref 22–32)
Calcium: 8.7 mg/dL — ABNORMAL LOW (ref 8.9–10.3)
Calcium: 8.4 mg/dL — ABNORMAL LOW (ref 8.9–10.3)
Chloride: 94 mmol/L — ABNORMAL LOW (ref 98–111)
Chloride: 96 mmol/L — ABNORMAL LOW (ref 98–111)
Creatinine, Ser: 1.21 mg/dL (ref 0.61–1.24)
Creatinine, Ser: 1.14 mg/dL (ref 0.61–1.24)
GFR, Estimated: >60 mL/min (ref >60)
GFR, Estimated: >60 mL/min (ref >60)
Glucose, Bld: 129 mg/dL — ABNORMAL HIGH (ref 70–99)
Glucose, Bld: 120 mg/dL — ABNORMAL HIGH (ref 70–99)
Potassium: 2.9 mmol/L — ABNORMAL LOW (ref 3.5–5.1)
Potassium: 3.1 mmol/L — ABNORMAL LOW (ref 3.5–5.1)
Sodium: 138 mmol/L (ref 135–145)
Sodium: 135 mmol/L (ref 135–145)

## 2020-10-14 LAB — TROPONIN I (HIGH SENSITIVITY)
Troponin I (High Sensitivity): 5 ng/L (ref <18)
Troponin I (High Sensitivity): 6 ng/L (ref <18)

## 2020-10-14 LAB — RESP PANEL BY RT-PCR (FLU A&B, COVID) ARPGX2
Influenza A by PCR: NEGATIVE
Influenza B by PCR: NEGATIVE
SARS Coronavirus 2 by RT PCR: POSITIVE — AB

## 2020-10-14 LAB — PROTIME-INR
INR: 0.9 (ref 0.8–1.2)
Prothrombin Time: 12 seconds (ref 11.4–15.2)

## 2020-10-14 LAB — LIPASE, BLOOD: Lipase: 56 U/L — ABNORMAL HIGH (ref 11–51)

## 2020-10-14 LAB — COMPREHENSIVE METABOLIC PANEL
ALT: 40 U/L (ref 0–44)
AST: 36 U/L (ref 15–41)
Albumin: 4.9 g/dL (ref 3.5–5.0)
Alkaline Phosphatase: 52 U/L (ref 38–126)
Anion gap: 19 — ABNORMAL HIGH (ref 5–15)
BUN: 57 mg/dL — ABNORMAL HIGH (ref 6–20)
CO2: 35 mmol/L — ABNORMAL HIGH (ref 22–32)
Calcium: 9.4 mg/dL (ref 8.9–10.3)
Chloride: 87 mmol/L — ABNORMAL LOW (ref 98–111)
Creatinine, Ser: 1.73 mg/dL — ABNORMAL HIGH (ref 0.61–1.24)
GFR, Estimated: 49 mL/min — ABNORMAL LOW (ref >60)
Glucose, Bld: 157 mg/dL — ABNORMAL HIGH (ref 70–99)
Potassium: 2.9 mmol/L — ABNORMAL LOW (ref 3.5–5.1)
Sodium: 141 mmol/L (ref 135–145)
Total Bilirubin: 1 mg/dL (ref 0.3–1.2)
Total Protein: 8.7 g/dL — ABNORMAL HIGH (ref 6.5–8.1)

## 2020-10-14 LAB — RAPID URINE DRUG SCREEN, HOSP PERFORMED
Amphetamines: NOT DETECTED
Barbiturates: NOT DETECTED
Benzodiazepines: NOT DETECTED
Cocaine: NOT DETECTED
Opiates: NOT DETECTED
Tetrahydrocannabinol: POSITIVE — AB

## 2020-10-14 LAB — BLOOD GAS, VENOUS
Acid-Base Excess: 16.1 mmol/L — ABNORMAL HIGH (ref 0.0–2.0)
Bicarbonate: 38.3 mmol/L — ABNORMAL HIGH (ref 20.0–28.0)
O2 Saturation: 82.4 %
Patient temperature: 98.6
pCO2, Ven: 32.3 mmHg — ABNORMAL LOW (ref 44.0–60.0)
pH, Ven: 7.673 (ref 7.250–7.430)
pO2, Ven: 39.5 mmHg (ref 32.0–45.0)

## 2020-10-14 LAB — LACTIC ACID, PLASMA
Lactic Acid, Venous: 1.9 mmol/L (ref 0.5–1.9)
Lactic Acid, Venous: 2.6 mmol/L (ref 0.5–1.9)
Lactic Acid, Venous: 1.9 mmol/L (ref 0.5–1.9)

## 2020-10-14 LAB — PHOSPHORUS: Phosphorus: 5 mg/dL — ABNORMAL HIGH (ref 2.5–4.6)

## 2020-10-14 LAB — HIV ANTIBODY (ROUTINE TESTING W REFLEX): HIV Screen 4th Generation wRfx: NONREACTIVE

## 2020-10-14 MED ORDER — TRAMADOL HCL 50 MG PO TABS
50.0000 mg | ORAL_TABLET | Freq: Three times a day (TID) | ORAL | Status: DC | PRN
  Administered 2020-10-14 – 2020-10-15 (×2): 50 mg via ORAL
  Filled 2020-10-14 (×2): qty 1

## 2020-10-14 MED ORDER — LACTATED RINGERS IV BOLUS
2000.0000 mL | Freq: Once | INTRAVENOUS | Status: AC
  Administered 2020-10-14: 2000 mL via INTRAVENOUS

## 2020-10-14 MED ORDER — ENOXAPARIN SODIUM 40 MG/0.4ML IJ SOSY
40.0000 mg | PREFILLED_SYRINGE | INTRAMUSCULAR | Status: DC
  Administered 2020-10-14 – 2020-10-15 (×2): 40 mg via SUBCUTANEOUS
  Filled 2020-10-14 (×2): qty 0.4

## 2020-10-14 MED ORDER — PHENOL 1.4 % MT LIQD
1.0000 | OROMUCOSAL | Status: DC | PRN
  Filled 2020-10-14: qty 177

## 2020-10-14 MED ORDER — ONDANSETRON HCL 4 MG/2ML IJ SOLN
4.0000 mg | Freq: Four times a day (QID) | INTRAMUSCULAR | Status: DC | PRN
  Administered 2020-10-14 – 2020-10-16 (×5): 4 mg via INTRAVENOUS
  Filled 2020-10-14 (×5): qty 2

## 2020-10-14 MED ORDER — ONDANSETRON HCL 4 MG PO TABS: 4.0000 mg | ORAL_TABLET | Freq: Four times a day (QID) | ORAL | Status: DC | PRN

## 2020-10-14 MED ORDER — ONDANSETRON HCL 4 MG/2ML IJ SOLN
4.0000 mg | Freq: Once | INTRAMUSCULAR | Status: AC
  Administered 2020-10-14: 4 mg via INTRAVENOUS
  Filled 2020-10-14: qty 2

## 2020-10-14 MED ORDER — POTASSIUM CHLORIDE 10 MEQ/100ML IV SOLN
10.0000 meq | INTRAVENOUS | Status: AC
  Filled 2020-10-14: qty 100

## 2020-10-14 MED ORDER — POTASSIUM CHLORIDE 10 MEQ/100ML IV SOLN
10.0000 meq | INTRAVENOUS | Status: AC
  Administered 2020-10-14 (×4): 10 meq via INTRAVENOUS
  Filled 2020-10-14 (×3): qty 100

## 2020-10-14 MED ORDER — MORPHINE SULFATE (PF) 2 MG/ML IV SOLN
2.0000 mg | Freq: Once | INTRAVENOUS | Status: AC
  Administered 2020-10-14: 2 mg via INTRAVENOUS
  Filled 2020-10-14: qty 1

## 2020-10-14 MED ORDER — ENSURE ENLIVE PO LIQD: 237.0000 mL | Freq: Two times a day (BID) | ORAL | Status: DC

## 2020-10-14 MED ORDER — PANTOPRAZOLE SODIUM 40 MG IV SOLR
40.0000 mg | INTRAVENOUS | Status: DC
  Administered 2020-10-15 – 2020-10-16 (×2): 40 mg via INTRAVENOUS
  Filled 2020-10-14 (×2): qty 40

## 2020-10-14 MED ORDER — LACTATED RINGERS IV SOLN: INTRAVENOUS | Status: DC

## 2020-10-14 MED ORDER — PANTOPRAZOLE SODIUM 40 MG IV SOLR
40.0000 mg | Freq: Once | INTRAVENOUS | Status: AC
  Administered 2020-10-14: 40 mg via INTRAVENOUS
  Filled 2020-10-14: qty 40

## 2020-10-14 NOTE — ED Notes (Signed)
Pt. Aware of urine specimen. Urinal at the bedside 

## 2020-10-14 NOTE — H&P (Signed)
History and Physical    Logan Little OFB:[id number] DOB: 1976/03/20 DOA: 10/14/2020  PCP: Patient, No Pcp Per (Inactive) Consultants:  none Patient coming from:  Home - lives with wife and grandson  Chief Complaint: intractable vomiting   HPI: Logan Little is a 45 y.o. male with no significant medical history except for tobacco abuse who presented to ED with covid + and severe nausea and vomiting since Thursday. He tested positive for covid on Thursday. On Wednesday he was at work, but had chills, fatigue and body aches. He also had some diarrhea, but this resolved on Thursday.  On Thursday he started to have intractable nausea and vomiting and states he threw up no less than 30 times. He states vomit had at times a reddish/black appearance. Denies coffee ground appearance. He does smoke, occasionally will have a beer. He has pain in his epigastric area after throwing up so much. Pain in his stomach described as dull and constant and rated 8-9/10, but is feeling better. He denies any fever, shortness of breath, cough or upper respiratory symptoms. He denies any swelling in his legs. He had hx of previous covid infection about one year ago.   -denies any travel. Has not eaten any food that was sitting out or not cooked. He has been around sick contacts who have covid. No other GI illness.   ED Course: vitals: bp: 107/77, HR: 64, RR: 20, oxygen: 97% room air  Labs: covid positive. Other pertinent labs include: Potassium: 2.9 Chloride: 87, Co2: 35, BUN: 57, Creatinine:1.73, Lactic acid: 1.9--->2.6 Wbc: 13.7, Hgb: 19/hct: 54.3, Mag: 2.6. bolused in ER and continued on LR at 200cc/hour. Potassium replaced and given zofran. Asked to admit for dehydration secondary to intractable vomiting.   Review of Systems: As per HPI; otherwise review of systems reviewed and negative.   Ambulatory Status:  Ambulates without assistance  COVID Vaccine Status:  None  No past medical history on file.  Past  Surgical History:  Procedure Laterality Date   CYST EXCISION      Social History   Socioeconomic History   Marital status: Married    Spouse name: Not on file   Number of children: Not on file   Years of education: Not on file   Highest education level: Not on file  Occupational History   Not on file  Tobacco Use   Smoking status: Every Day    Packs/day: 1.00    Pack years: 0.00    Types: Cigarettes   Smokeless tobacco: Never  Substance and Sexual Activity   Alcohol use: Yes    Comment: ocasionally   Drug use: Yes    Types: Marijuana    Comment: 2-3 days ago   Sexual activity: Not on file  Other Topics Concern   Not on file  Social History Narrative   Not on file   Social Determinants of Health   Financial Resource Strain: Not on file  Food Insecurity: Not on file  Transportation Needs: Not on file  Physical Activity: Not on file  Stress: Not on file  Social Connections: Not on file  Intimate Partner Violence: Not on file    No Known Allergies  No family history on file.  Prior to Admission medications   Not on File    Physical Exam: Vitals:   10/14/20 0930 10/14/20 1000 10/14/20 1100 10/14/20 1200  BP: 101/89 117/74 108/77 103/75  Pulse: 61 (!) 45 (!) 53 (!) 52  Resp: 18 16 17  18  Temp:      TempSrc:      SpO2: 100% 95% 100% 100%  Weight:      Height:         General:  Appears calm and comfortable and is in NAD Eyes:  PERRL, EOMI, normal lids, iris ENT:  grossly normal hearing, lips & tongue, mildly dry mucous membranes. No cracking.  appropriate dentition Neck:  no LAD, masses or thyromegaly; no carotid bruits Cardiovascular:  RRR, no m/r/g. No LE edema.  Respiratory:   CTA bilaterally with no wheezes/rales/rhonchi.  Normal respiratory effort. Abdomen:  soft, mildly TTP in epigastric area, ND, NABS Back:   normal alignment, no CVAT Skin:  no rash or induration seen on limited exam Musculoskeletal:  grossly normal tone BUE/BLE, good ROM, no  bony abnormality Lower extremity:  No LE edema.  Limited foot exam with no ulcerations.  2+ distal pulses. Psychiatric:  grossly normal mood and affect, speech fluent and appropriate, AOx3 Neurologic:  CN 2-12 grossly intact, moves all extremities in coordinated fashion, sensation intact    Radiological Exams on Admission: Independently reviewed - see discussion in A/P where applicable  DG Chest Port 1 View  Result Date: 10/14/2020 CLINICAL DATA:  COVID-19.  Coughing.  Epigastric abdominal pain. EXAM: PORTABLE CHEST 1 VIEW COMPARISON:  06/02/2016 FINDINGS: Numerous leads and wires project over the chest. Midline trachea. Normal heart size and mediastinal contours. No pleural effusion or pneumothorax. Hyperinflation. Clear lungs. IMPRESSION: Hyperinflation, without acute disease. Electronically Signed   By: Jeronimo Greaves M.D.   On: 10/14/2020 10:14    Ekg; sinus bradycardia with rate of 56. Non specific T wave similar to previous EKG.   Labs on Admission: I have personally reviewed the available labs and imaging studies at the time of the admission.  Pertinent labs:  Potassium: 2.9 Chloride: 87 Co2: 35 BUN: 57 Creatinine:1.73--->1.21 Lactic acid: 1.9--->2.6--->1.9 Wbc: 13.7 Hgb: 19/hct: 54.3 Mag: 2.6 Covid: positive   cxr: no acute process   Assessment/Plan Principal Problem:   Dehydration -secondary to intractable vomiting  -IVF bolus in ER with high rate of IVF. Repeat bmp with improvement and feeling better. Turn down to 150cc/hour  -continue IVF and encouraged oral PO liquid if he feels like drinking and won't make him nauseated.  -follow labs  Active Problems:   Intractable vomiting with nausea -secondary to covid vs. GI bug -improved since arrival -continue IVF and anti-emetics  -continue protonix     COVID-19 virus infection -day 4 of illness. No respiratory symptoms at all. Hx of previous covid infection 1 year ago and exposure this time -supportive therapy as  oxygenation is excellent on room air and has no symptoms. Would start vitamins, but may upset his stomach at this point.  -vitamin D level pending -discussed paxlovid, but he is low risk with no co morbidities and no upper respiratory symptoms so will continue conservative therapy.  -airborne and contact precautions     Hypokalemia -repleted with total in ER -repeat bmp with no change, but likely has only had 10-75meq at this point -repeat bmp after 4th run and will do an additional if needed  -recheck in AM with mag  -tele overnight     AKI (acute kidney injury) (HCC) -pre renal secondary to dehydration from intractable vomiting -resolving with IVF, continue overnight and recheck in AM -on no NSAIDs or nephrotoxic drugs.     Hypermagnesemia -likely secondary to dehydration -IVF and recheck in AM    Elevated lactic acid  level -secondary to dehydration, repeat wnl, continue hydration with IVF    Tobacco abuse  -declines patch    Body mass index is 26.63 kg/m.    Level of care: Telemetry DVT prophylaxis:  Lovenox  Code Status:  Full - confirmed with patient Family Communication: None present Disposition Plan:  The patient is from: home  Anticipated d/c is to: home  Anticipated d/c date will depend on clinical response to treatment, but possibly as early as tomorrow if he does well    Consults called: none  Admission status:  observation    Orland Mustard MD [facility]   How to contact the [facility] Attending or Consulting provider 7A - 7P or covering provider during after hours 7P -7A, for this patient?  Check the care team in [facility] and look for a) attending/consulting [facility] provider listed and b) the [facility] team listed Log into [email or web address] and use [place]'s universal password to access. If you do not have the password, please contact the hospital operator. Locate the [facility] provider you are looking for under [facility] and page to a number that you  can be directly reached. If you still have difficulty reaching the provider, please page the [facility] (Director on Call) for the Hospitalists listed on amion for assistance.   10/14/2020, 1:27 PM

## 2020-10-14 NOTE — ED Triage Notes (Addendum)
Pt took a home test and diagnosed with Covid with home test on Thursday. Pt came in with c/o vomiting starting Thursday. Also endorses epigastric abdominal pain. States he feels dehydrated and cannot hold anything down. Pt endorses diarrhea

## 2020-10-14 NOTE — ED Notes (Signed)
RN notified of lab result 

## 2020-10-14 NOTE — ED Notes (Signed)
Pt. Was given water per Nurse (RN).

## 2020-10-14 NOTE — ED Notes (Signed)
Lactic 2.6, Pfeiffer MD notified via phone call.

## 2020-10-14 NOTE — ED Provider Notes (Signed)
[facility]-EMERGENCY DEPT Provider Note   CSN: [id number] Arrival date & time: 10/14/20  9371     History Chief Complaint  Patient presents with   Emesis   Abdominal Pain    Logan Little is a 45 y.o. male.  HPI Patient denies he has any medical problems.  He reports Wednesday he felt like he had a fever and body aches.  He took a home COVID test on Thursday and it was positive.  Patient reports Thursday evening he started vomiting.  He reports he also had diarrhea at that time.  He reports he continued to vomit a lot all of Thursday, Friday and last night.  He reports now it is coming up is just a dark looking material that he thinks might be bloody.  He reports after all of this vomiting he has developed a lot of burning in his upper abdomen.  He reports it also burns in his throat and on his lips.  He does not feel short of breath.  He reports that he has had minimal cough.  He reports very mild headache.  He reports his urine looks very concentrated and dark yellow.  He has been trying to drink Pedialyte but not been able to keep much of anything down.  Patient reports that he does smoke cigarettes but has not smoked for about the past 5 days since he has been sick.  He reports he also occasionally smokes marijuana but none over the past several days.  Patient reports he typically drinks a few beers per day, more on the weekends.  None over the past several days.  At baseline, he feels well, is healthy active and does not have any medical problems.    No past medical history on file.  There are no problems to display for this patient.   Past Surgical History:  Procedure Laterality Date   CYST EXCISION         No family history on file.  Social History   Tobacco Use   Smoking status: Every Day    Packs/day: 1.00    Pack years: 0.00    Types: Cigarettes   Smokeless tobacco: Never  Substance Use Topics   Alcohol use: Yes    Comment: ocasionally    Drug use: Yes    Types: Marijuana    Comment: 2-3 days ago    Home Medications Prior to Admission medications   Medication Sig Start Date End Date Taking? Authorizing Provider  omeprazole (PRILOSEC) 20 MG capsule Take 1 capsule (20 mg total) by mouth daily. 10/16/18   Cathie Hoops, Amy V, PA-C  ondansetron (ZOFRAN ODT) 4 MG disintegrating tablet Take 1 tablet (4 mg total) by mouth every 8 (eight) hours as needed for nausea or vomiting. 10/16/18   Cathie Hoops, Amy V, PA-C  pantoprazole (PROTONIX) 40 MG tablet Take 1 tablet (40 mg total) by mouth daily. 10/17/18 12/16/18  Minna Antis, MD  promethazine (PHENERGAN) 25 MG tablet Take 1 tablet (25 mg total) by mouth every 6 (six) hours as needed for nausea or vomiting. 10/17/18   Minna Antis, MD    Allergies    Patient has no known allergies.  Review of Systems   Review of Systems 10 systems reviewed and negative except as per HPI Physical Exam Updated Vital Signs BP 112/76   Pulse (!) 56   Temp 98.2 F (36.8 C) (Oral)   Resp 19   Ht 5\' 6"  (1.676 m)   Wt 74.8 kg  SpO2 100%   BMI 26.63 kg/m   Physical Exam Constitutional:      Comments: Patient is alert with clear mental status.  He does appear uncomfortable and ill.  Color is good.  HENT:     Little: Normocephalic and atraumatic.     Mouth/Throat:     Comments: Mouth is very dry.  Posterior oropharynx widely patent. Eyes:     Extraocular Movements: Extraocular movements intact.     Pupils: Pupils are equal, round, and reactive to light.  Cardiovascular:     Rate and Rhythm: Normal rate and regular rhythm.  Pulmonary:     Effort: Pulmonary effort is normal.     Breath sounds: Normal breath sounds.  Abdominal:     Comments: Abdomen is soft.  Moderate diffuse upper abdominal discomfort to palpation.  No guarding.  Musculoskeletal:        General: No swelling or tenderness. Normal range of motion.     Cervical back: Neck supple.     Right lower leg: No edema.     Left lower leg: No  edema.  Lymphadenopathy:     Cervical: No cervical adenopathy.  Skin:    General: Skin is warm and dry.  Neurological:     General: No focal deficit present.     Mental Status: He is oriented to person, place, and time.     Motor: No weakness.     Coordination: Coordination normal.    ED Results / Procedures / Treatments   Labs (all labs ordered are listed, but only abnormal results are displayed) Labs Reviewed - No data to display  EKG EKG Interpretation  Date/Time:  Saturday October 14 2020 14:21:16 EDT Ventricular Rate:  56 PR Interval:  150 QRS Duration: 130 QT Interval:  460 QTC Calculation: 444 R Axis:   -37 Text Interpretation: Sinus rhythm Nonspecific IVCD with LAD Abnrm T, consider ischemia, anterolateral lds , new since last tracing Confirmed by Linwood Dibbles [phone]) on 10/15/2020 4:31:13 PM  Radiology No results found.  Procedures Procedures   Medications Ordered in ED Medications - No data to display  ED Course  I have reviewed the triage vital signs and the nursing notes.  Pertinent labs & imaging results that were available during my care of the patient were reviewed by me and considered in my medical decision making (see chart for details).    MDM Rules/Calculators/A&P                          Patient presents with symptoms as outlined.  Clinically he appears dehydrated.  Will initiate rehydration with 2 L lactic Ringer's.  Will treat with Protonix and Zofran for symptoms of gastritis.  We will proceed with diagnostic evaluation for COVID, dehydration and abdominal pain.   Logan Little was evaluated in Emergency Department on 10/29/2020 for the symptoms described in the history of present illness. He was evaluated in the context of the global COVID-19 pandemic, which necessitated consideration that the patient might be at risk for infection with the SARS-CoV-2 virus that causes COVID-19. Institutional protocols and algorithms that pertain to the evaluation  of patients at risk for COVID-19 are in a state of rapid change based on information released by regulatory bodies including the CDC and federal and state organizations. These policies and algorithms were followed during the patient's care in the ED.  Final Clinical Impression(s) / ED Diagnoses Final diagnoses:  Dehydration  COVID  AKI (acute  kidney injury) [facility])    Rx / DC Orders ED Discharge Orders     None        Arby Barrette, MD 10/29/20 1044

## 2020-10-15 DIAGNOSIS — F17210 Nicotine dependence, cigarettes, uncomplicated: Secondary | ICD-10-CM | POA: Diagnosis present

## 2020-10-15 DIAGNOSIS — A0839 Other viral enteritis: Secondary | ICD-10-CM | POA: Diagnosis present

## 2020-10-15 DIAGNOSIS — U071 COVID-19: Secondary | ICD-10-CM | POA: Diagnosis present

## 2020-10-15 DIAGNOSIS — Z79899 Other long term (current) drug therapy: Secondary | ICD-10-CM | POA: Diagnosis not present

## 2020-10-15 DIAGNOSIS — E860 Dehydration: Principal | ICD-10-CM

## 2020-10-15 DIAGNOSIS — E872 Acidosis: Secondary | ICD-10-CM | POA: Diagnosis present

## 2020-10-15 DIAGNOSIS — E876 Hypokalemia: Secondary | ICD-10-CM | POA: Diagnosis present

## 2020-10-15 DIAGNOSIS — Z8616 Personal history of COVID-19: Secondary | ICD-10-CM | POA: Diagnosis not present

## 2020-10-15 DIAGNOSIS — Z28310 Unvaccinated for covid-19: Secondary | ICD-10-CM | POA: Diagnosis not present

## 2020-10-15 DIAGNOSIS — N179 Acute kidney failure, unspecified: Secondary | ICD-10-CM | POA: Diagnosis present

## 2020-10-15 LAB — CBC
HCT: 45.8 % (ref 39.0–52.0)
Hemoglobin: 15.2 g/dL (ref 13.0–17.0)
MCH: 30.2 pg (ref 26.0–34.0)
MCHC: 33.2 g/dL (ref 30.0–36.0)
MCV: 90.9 fL (ref 80.0–100.0)
Platelets: 171 10*3/uL (ref 150–400)
RBC: 5.04 MIL/uL (ref 4.22–5.81)
RDW: 13.2 % (ref 11.5–15.5)
WBC: 12.2 10*3/uL — ABNORMAL HIGH (ref 4.0–10.5)
nRBC: 0 % (ref 0.0–0.2)

## 2020-10-15 LAB — COMPREHENSIVE METABOLIC PANEL
ALT: 55 U/L — ABNORMAL HIGH (ref 0–44)
AST: 46 U/L — ABNORMAL HIGH (ref 15–41)
Albumin: 3.7 g/dL (ref 3.5–5.0)
Alkaline Phosphatase: 43 U/L (ref 38–126)
Anion gap: 6 (ref 5–15)
BUN: 29 mg/dL — ABNORMAL HIGH (ref 6–20)
CO2: 33 mmol/L — ABNORMAL HIGH (ref 22–32)
Calcium: 8.4 mg/dL — ABNORMAL LOW (ref 8.9–10.3)
Chloride: 99 mmol/L (ref 98–111)
Creatinine, Ser: 1.05 mg/dL (ref 0.61–1.24)
GFR, Estimated: >60 mL/min (ref >60)
Glucose, Bld: 102 mg/dL — ABNORMAL HIGH (ref 70–99)
Potassium: 3.4 mmol/L — ABNORMAL LOW (ref 3.5–5.1)
Sodium: 138 mmol/L (ref 135–145)
Total Bilirubin: 1.3 mg/dL — ABNORMAL HIGH (ref 0.3–1.2)
Total Protein: 6.3 g/dL — ABNORMAL LOW (ref 6.5–8.1)

## 2020-10-15 LAB — MAGNESIUM: Magnesium: 2.2 mg/dL (ref 1.7–2.4)

## 2020-10-15 MED ORDER — MORPHINE SULFATE (PF) 2 MG/ML IV SOLN
2.0000 mg | Freq: Once | INTRAVENOUS | Status: AC
  Administered 2020-10-15: 2 mg via INTRAVENOUS
  Filled 2020-10-15: qty 1

## 2020-10-15 MED ORDER — KETOROLAC TROMETHAMINE 30 MG/ML IJ SOLN
30.0000 mg | Freq: Once | INTRAMUSCULAR | Status: AC
  Administered 2020-10-15: 30 mg via INTRAVENOUS
  Filled 2020-10-15: qty 1

## 2020-10-15 MED ORDER — POTASSIUM CHLORIDE 10 MEQ/50ML IV SOLN: 10.0000 meq | INTRAVENOUS | Status: DC

## 2020-10-15 MED ORDER — MORPHINE SULFATE (PF) 2 MG/ML IV SOLN: 2.0000 mg | Freq: Four times a day (QID) | INTRAVENOUS | Status: DC | PRN

## 2020-10-15 MED ORDER — POTASSIUM CHLORIDE 10 MEQ/100ML IV SOLN: 10.0000 meq | INTRAVENOUS | Status: DC

## 2020-10-15 MED ORDER — MORPHINE SULFATE (PF) 2 MG/ML IV SOLN
2.0000 mg | Freq: Three times a day (TID) | INTRAVENOUS | Status: DC | PRN
  Administered 2020-10-15 (×2): 2 mg via INTRAVENOUS
  Filled 2020-10-15 (×2): qty 1

## 2020-10-15 MED ORDER — LIP MEDEX EX OINT
TOPICAL_OINTMENT | CUTANEOUS | Status: DC | PRN
  Filled 2020-10-15: qty 7

## 2020-10-15 MED ORDER — POTASSIUM CHLORIDE 20 MEQ PO PACK: 40.0000 meq | PACK | Freq: Once | ORAL | Status: DC

## 2020-10-15 MED ORDER — POTASSIUM CHLORIDE 10 MEQ/100ML IV SOLN
10.0000 meq | INTRAVENOUS | Status: AC
Start: 2020-10-15 — End: 2020-10-15
  Administered 2020-10-15 (×3): 10 meq via INTRAVENOUS
  Filled 2020-10-15 (×3): qty 100

## 2020-10-15 NOTE — Progress Notes (Signed)
PROGRESS NOTE    Logan Little  UJW:[id number] DOB: 1976-03-28 DOA: 10/14/2020 PCP: Patient, No Pcp Per (Inactive)   Brief Narrative:  This 45 years old male with no significant PMH except tobacco abuse presented to the ED with severe nausea and vomiting for 3 days.  Patient reports he had developed chills, fatigue and body ache at work and later in the day he developed some diarrhea.  He tested positive for COVID 3 days ago.  He has been throwing up for last 3 days.  He stated he has vomited 30 times.  He also reports pain in the epigastric region after throwing up.  Denies any recent travel, sick contact. Patient is admitted for intractable nausea and vomiting, started on IV hydration. COVID +   Assessment & Plan:   Principal Problem:   Dehydration Active Problems:   Intractable vomiting with nausea   COVID-19 virus infection   Hypokalemia   AKI (acute kidney injury) (HCC)   Hypermagnesemia   Elevated lactic acid level   Tobacco abuse   Intractable nausea and vomiting / Abdominal pain: Patient presented with fatigue, body ache and weakness. Continue IV hydration, encourage oral intake. Continue Zofran and Phenergan IV as needed. It could be viral gastroenteritis due to COVID. Continue supportive care, reports some improvement. Continue Protonix 40 mg IV every 12hr  COVID 19+: Day 5 of illness.  No respiratory symptoms at all. Patient is saturating 100% on room air. Discussed paxloid but he is low risk with no comorbidities and no respiratory symptoms. Continue contact and airborne precautions.  Hypokalemia: Replaced, continue to monitor  AKI: It could be prerenal in the setting of COVID and dehydration. Renal function improved and back to baseline with IV hydration.  Lactic acidosis: Could be secondary to dehydration, normalized with hydration  Tobacco use :  nicotine patch.   DVT prophylaxis: Lovenox Code Status: Full code Family Communication: No family at  bedside Disposition Plan:   Status is: Inpatient  Remains inpatient appropriate because:Inpatient level of care appropriate due to severity of illness  Dispo: The patient is from: Home              Anticipated d/c is to: Home              Patient currently is not medically stable to d/c.   Difficult to place patient No  Consultants:  None  Procedures: None  Antimicrobials:   Anti-infectives (From admission, onward)    None       Subjective: Patient was seen and examined at bedside.  Overnight events noted.   Patient reports having abdominal pain and feeling nauseous but denies vomiting.  Objective: Vitals:   10/15/20 0146 10/15/20 0539 10/15/20 1037 10/15/20 1311  BP: 107/77 107/78 104/77 105/72  Pulse: (!) 59 (!) 51 62 (!) 51  Resp: 16 14 16 18   Temp: 98.7 F (37.1 C) 98.2 F (36.8 C) 98.3 F (36.8 C) 98.2 F (36.8 C)  TempSrc: Oral Oral Oral Axillary  SpO2: 100% 98% 100% 100%  Weight:      Height:        Intake/Output Summary (Last 24 hours) at 10/15/2020 1417 Last data filed at 10/15/2020 1314 Gross per 24 hour  Intake 839.49 ml  Output 550 ml  Net 289.49 ml   Filed Weights   10/14/20 0643  Weight: 74.8 kg    Examination:  General exam: Appears calm and comfortable, not in any acute distress Respiratory system: Clear to auscultation. Respiratory effort  normal. Cardiovascular system: S1 & S2 heard, RRR. No JVD, murmurs, rubs, gallops or clicks. No pedal edema. Gastrointestinal system: Abdomen is nondistended, soft and nontender. No organomegaly or masses felt. Normal bowel sounds heard. Central nervous system: Alert and oriented. No focal neurological deficits. Extremities: Symmetric 5 x 5 power.  No edema, no cyanosis, no clubbing. Skin: No rashes, lesions or ulcers Psychiatry: Judgement and insight appear normal. Mood & affect appropriate.     Data Reviewed: I have personally reviewed following labs and imaging studies  CBC: Recent Labs  Lab  10/14/20 0910 10/15/20 0346  WBC 13.7* 12.2*  NEUTROABS 11.6*  --   HGB 19.0* 15.2  HCT 54.3* 45.8  MCV 86.7 90.9  PLT 239 171   Basic Metabolic Panel: Recent Labs  Lab 10/14/20 0910 10/14/20 1354 10/14/20 1824 10/15/20 0346  NA 141 138 135 138  K 2.9* 2.9* 3.1* 3.4*  CL 87* 94* 96* 99  CO2 35* 33* 30 33*  GLUCOSE 157* 129* 120* 102*  BUN 57* 43* 35* 29*  CREATININE 1.73* 1.21 1.14 1.05  CALCIUM 9.4 8.7* 8.4* 8.4*  MG 2.6*  --   --  2.2  PHOS 5.0*  --   --   --    GFR: Estimated Creatinine Clearance: 80.2 mL/min (by C-G formula based on SCr of 1.05 mg/dL). Liver Function Tests: Recent Labs  Lab 10/14/20 0910 10/15/20 0346  AST 36 46*  ALT 40 55*  ALKPHOS 52 43  BILITOT 1.0 1.3*  PROT 8.7* 6.3*  ALBUMIN 4.9 3.7   Recent Labs  Lab 10/14/20 0910  LIPASE 56*   No results for input(s): AMMONIA in the last 168 hours. Coagulation Profile: Recent Labs  Lab 10/14/20 0910  INR 0.9   Cardiac Enzymes: No results for input(s): CKTOTAL, CKMB, CKMBINDEX, TROPONINI in the last 168 hours. BNP (last 3 results) No results for input(s): PROBNP in the last 8760 hours. HbA1C: No results for input(s): HGBA1C in the last 72 hours. CBG: No results for input(s): GLUCAP in the last 168 hours. Lipid Profile: No results for input(s): CHOL, HDL, LDLCALC, TRIG, CHOLHDL, LDLDIRECT in the last 72 hours. Thyroid Function Tests: No results for input(s): TSH, T4TOTAL, FREET4, T3FREE, THYROIDAB in the last 72 hours. Anemia Panel: No results for input(s): VITAMINB12, FOLATE, FERRITIN, TIBC, IRON, RETICCTPCT in the last 72 hours. Sepsis Labs: Recent Labs  Lab 10/14/20 0910 10/14/20 1058 10/14/20 1354  LATICACIDVEN 1.9 2.6* 1.9    Recent Results (from the past 240 hour(s))  Resp Panel by RT-PCR (Flu A&B, Covid) Nasopharyngeal Swab     Status: Abnormal   Collection Time: 10/14/20  9:10 AM   Specimen: Nasopharyngeal Swab; Nasopharyngeal(NP) swabs in vial transport medium   Result Value Ref Range Status   SARS Coronavirus 2 by RT PCR POSITIVE (A) NEGATIVE Final    Comment: RESULT CALLED TO, READ BACK BY AND VERIFIED WITH: WEST,S. RN AT 1056 10/14/20 MULLINS,T (NOTE) SARS-CoV-2 target nucleic acids are DETECTED.  The SARS-CoV-2 RNA is generally detectable in upper respiratory specimens during the acute phase of infection. Positive results are indicative of the presence of the identified virus, but do not rule out bacterial infection or co-infection with other pathogens not detected by the test. Clinical correlation with patient history and other diagnostic information is necessary to determine patient infection status. The expected result is Negative.  Fact Sheet for Patients: BloggerCourse.com  Fact Sheet for Healthcare Providers: SeriousBroker.it  This test is not yet approved or cleared by the  [place] [employer] and  has been authorized for detection and/or diagnosis of SARS-CoV-2 by FDA under an TEFL teacher (EUA).  This EUA will remain in effect (meaning this test ca n be used) for the duration of  the COVID-19 declaration under Section 564(b)(1) of the Act, 21 U.S.C. section 360bbb-3(b)(1), unless the authorization is terminated or revoked sooner.     Influenza A by PCR NEGATIVE NEGATIVE Final   Influenza B by PCR NEGATIVE NEGATIVE Final    Comment: (NOTE) The Xpert Xpress SARS-CoV-2/FLU/RSV plus assay is intended as an aid in the diagnosis of influenza from Nasopharyngeal swab specimens and should not be used as a sole basis for treatment. Nasal washings and aspirates are unacceptable for Xpert Xpress SARS-CoV-2/FLU/RSV testing.  Fact Sheet for Patients: BloggerCourse.com  Fact Sheet for Healthcare Providers: SeriousBroker.it  This test is not yet approved or cleared by the [place] FDA and has been authorized for  detection and/or diagnosis of SARS-CoV-2 by FDA under an Emergency Use Authorization (EUA). This EUA will remain in effect (meaning this test can be used) for the duration of the COVID-19 declaration under Section 564(b)(1) of the Act, 21 U.S.C. section 360bbb-3(b)(1), unless the authorization is terminated or revoked.  Performed at [facility], [address] [place], [place] [address]     Radiology Studies: [facility] Chest Port 1 View  Result Date: 10/14/2020 CLINICAL DATA:  COVID-19.  Coughing.  Epigastric abdominal pain. EXAM: PORTABLE CHEST 1 VIEW COMPARISON:  06/02/2016 FINDINGS: Numerous leads and wires project over the chest. Midline trachea. Normal heart size and mediastinal contours. No pleural effusion or pneumothorax. Hyperinflation. Clear lungs. IMPRESSION: Hyperinflation, without acute disease. Electronically Signed   By: Jeronimo Greaves M.D.   On: 10/14/2020 10:14    Scheduled Meds:  enoxaparin (LOVENOX) injection  40 mg Subcutaneous Q24H   feeding supplement  237 mL Oral BID BM   pantoprazole (PROTONIX) IV  40 mg Intravenous Q24H   Continuous Infusions:  lactated ringers 150 mL/hr at 10/15/20 1109   potassium chloride 10 mEq (10/15/20 1303)     LOS: 0 days    Time spent: 35 mins    Toua Stites, MD [facility]   If 7PM-7AM, please contact night-coverage

## 2020-10-16 LAB — CBC
HCT: 42.1 % (ref 39.0–52.0)
Hemoglobin: 13.8 g/dL (ref 13.0–17.0)
MCH: 29.9 pg (ref 26.0–34.0)
MCHC: 32.8 g/dL (ref 30.0–36.0)
MCV: 91.1 fL (ref 80.0–100.0)
Platelets: 146 10*3/uL — ABNORMAL LOW (ref 150–400)
RBC: 4.62 MIL/uL (ref 4.22–5.81)
RDW: 12.7 % (ref 11.5–15.5)
WBC: 7.5 10*3/uL (ref 4.0–10.5)
nRBC: 0 % (ref 0.0–0.2)

## 2020-10-16 LAB — COMPREHENSIVE METABOLIC PANEL
ALT: 65 U/L — ABNORMAL HIGH (ref 0–44)
AST: 42 U/L — ABNORMAL HIGH (ref 15–41)
Albumin: 3.2 g/dL — ABNORMAL LOW (ref 3.5–5.0)
Alkaline Phosphatase: 38 U/L (ref 38–126)
Anion gap: 5 (ref 5–15)
BUN: 21 mg/dL — ABNORMAL HIGH (ref 6–20)
CO2: 28 mmol/L (ref 22–32)
Calcium: 8.1 mg/dL — ABNORMAL LOW (ref 8.9–10.3)
Chloride: 102 mmol/L (ref 98–111)
Creatinine, Ser: 0.89 mg/dL (ref 0.61–1.24)
GFR, Estimated: >60 mL/min (ref >60)
Glucose, Bld: 93 mg/dL (ref 70–99)
Potassium: 3.9 mmol/L (ref 3.5–5.1)
Sodium: 135 mmol/L (ref 135–145)
Total Bilirubin: 1.6 mg/dL — ABNORMAL HIGH (ref 0.3–1.2)
Total Protein: 5.6 g/dL — ABNORMAL LOW (ref 6.5–8.1)

## 2020-10-16 LAB — MAGNESIUM: Magnesium: 1.8 mg/dL (ref 1.7–2.4)

## 2020-10-16 LAB — PHOSPHORUS: Phosphorus: 2.4 mg/dL — ABNORMAL LOW (ref 2.5–4.6)

## 2020-10-16 MED ORDER — ONDANSETRON HCL 4 MG PO TABS: 4.0000 mg | ORAL_TABLET | Freq: Every day | ORAL | 0 refills | Status: AC | PRN

## 2020-10-16 MED ORDER — PANTOPRAZOLE SODIUM 40 MG PO TBEC: 40.0000 mg | DELAYED_RELEASE_TABLET | Freq: Every day | ORAL | 1 refills | Status: AC

## 2020-10-16 MED ORDER — KETOROLAC TROMETHAMINE 30 MG/ML IJ SOLN
30.0000 mg | Freq: Once | INTRAMUSCULAR | Status: AC
  Administered 2020-10-16: 30 mg via INTRAVENOUS
  Filled 2020-10-16: qty 1

## 2020-10-16 MED ORDER — K PHOS MONO-SOD PHOS DI & MONO 155-852-130 MG PO TABS
250.0000 mg | ORAL_TABLET | Freq: Three times a day (TID) | ORAL | Status: DC
  Administered 2020-10-16: 250 mg via ORAL
  Filled 2020-10-16 (×2): qty 1

## 2020-10-16 NOTE — Discharge Summary (Signed)
Physician Discharge Summary  Logan Little YYT:[id number] DOB: 03-16-1976 DOA: 10/14/2020  PCP: Patient, No Pcp Per (Inactive)  Admit date: 10/14/2020  Discharge date: 10/16/2020  Admitted From: Home.  Disposition:  Home.  Recommendations for Outpatient Follow-up:  Follow up with PCP in 1-2 weeks. Please obtain BMP/CBC in one week. Advised to take pantoprazole 40 mg before breakfast daily.  Home Health:None. Equipment/Devices:None  Discharge Condition: Good CODE STATUS:Full code Diet recommendation: Heart Healthy  Brief Summary/ Hospital Course: This 45 years old male with no significant PMH except tobacco abuse presented in the ED with severe nausea and vomiting for 3 days.  Patient reports he had developed chills, fatigue and body ache at work and later in the day he developed some diarrhea. He tested positive for COVID 3 days ago.  He has been throwing up for last 3 days. He stated he has vomited 30 times.  He also reports pain in the epigastric region after throwing up.  Denies any recent travel, sick contact. Patient was admitted for intractable nausea and vomiting, started on IV hydration. COVID +, patient did not have any respiratory symptoms so remdesivir  or steroids were not started.  Patient was continued on IV Zofran and continued on IV hydration.  Patient was continued on contact and airborne precautions.  Renal functions has improved.  Electrolytes normalized.  Nausea and vomiting has resolved.  Patient has tolerated regular diet.  Patient feels better and wants to be discharged.  Patient is being discharged home.  He was managed for below problems.  Discharge Diagnoses:  Principal Problem:   Dehydration Active Problems:   Intractable vomiting with nausea   COVID-19 virus infection   Hypokalemia   AKI (acute kidney injury) (HCC)   Hypermagnesemia   Elevated lactic acid level   Tobacco abuse  Intractable nausea and vomiting / Abdominal pain: Patient presented with  fatigue, body ache and weakness. IV hydration, encourage oral intake. Continue Zofran and Phenergan IV as needed. It could be viral gastroenteritis due to COVID. Continue supportive care, reports some improvement. Continue Protonix 40 mg IV every 12hr Nausea and vomiting has resolved.  Abdominal pain improved. Tolerated regular diet.   COVID 19+: Day 6 of illness.  No respiratory symptoms at all. Patient is saturating 100% on room air. Discussed paxloid but he is low risk with no comorbidities and no respiratory symptoms. Continue contact and airborne precautions.   Hypokalemia: Replaced, Improved.   AKI: It could be prerenal in the setting of COVID and dehydration. Renal function improved and back to baseline with IV hydration.   Lactic acidosis: Could be secondary to dehydration, normalized with hydration   Tobacco use :  nicotine patch.    Discharge Instructions  Discharge Instructions     Call MD for:  difficulty breathing, headache or visual disturbances   Complete by: As directed    Call MD for:  persistant dizziness or light-headedness   Complete by: As directed    Call MD for:  persistant nausea and vomiting   Complete by: As directed    Diet - low sodium heart healthy   Complete by: As directed    Diet Carb Modified   Complete by: As directed    Discharge instructions   Complete by: As directed    Advised to follow-up with primary care physician in 1 week. Advised to remain in quarantine for few days.   Increase activity slowly   Complete by: As directed       Allergies  as of 10/16/2020   No Known Allergies      Medication List     TAKE these medications    ondansetron 4 MG tablet Commonly known as: Zofran Take 1 tablet (4 mg total) by mouth daily as needed for nausea or vomiting.   pantoprazole 40 MG tablet Commonly known as: Protonix Take 1 tablet (40 mg total) by mouth daily.        Follow-up Information     [facility] Follow up in 1 week(s).   Specialty: Family Medicine Contact information: [address] [place] [id number] [phone]                No Known Allergies  Consultations: NONE   Procedures/Studies: DG Chest Port 1 View  Result Date: 10/14/2020 CLINICAL DATA:  COVID-19.  Coughing.  Epigastric abdominal pain. EXAM: PORTABLE CHEST 1 VIEW COMPARISON:  06/02/2016 FINDINGS: Numerous leads and wires project over the chest. Midline trachea. Normal heart size and mediastinal contours. No pleural effusion or pneumothorax. Hyperinflation. Clear lungs. IMPRESSION: Hyperinflation, without acute disease. Electronically Signed   By: Jeronimo GreavesKyle  Talbot M.D.   On: 10/14/2020 10:14      Subjective: Patient was seen and examined at bedside,  overnight event noted, Patient reports feeling much better , nausea and vomiting has resolved . Patient wants to be discharged.  Patient tolerated regular diet.  Discharge Exam: Vitals:   10/15/20 2046 10/16/20 0438  BP: 100/62 104/75  Pulse: (!) 51 (!) 50  Resp: 15 16  Temp: 98.4 F (36.9 C) 98.6 F (37 C)  SpO2: 100% 99%   Vitals:   10/15/20 1037 10/15/20 1311 10/15/20 2046 10/16/20 0438  BP: 104/77 105/72 100/62 104/75  Pulse: 62 (!) 51 (!) 51 (!) 50  Resp: 16 18 15 16   Temp: 98.3 F (36.8 C) 98.2 F (36.8 C) 98.4 F (36.9 C) 98.6 F (37 C)  TempSrc: Oral Axillary Oral Oral  SpO2: 100% 100% 100% 99%  Weight:      Height:        General: Pt is alert, awake, not in acute distress Cardiovascular: RRR, S1/S2 +, no rubs, no gallops Respiratory: CTA bilaterally, no wheezing, no rhonchi Abdominal: Soft, NT, ND, bowel sounds + Extremities: no edema, no cyanosis    The results of significant diagnostics from this hospitalization (including imaging, microbiology, ancillary and laboratory) are listed below for reference.     Microbiology: Recent Results (from the past 240 hour(s))  Resp Panel by RT-PCR (Flu  A&B, Covid) Nasopharyngeal Swab     Status: Abnormal   Collection Time: 10/14/20  9:10 AM   Specimen: Nasopharyngeal Swab; Nasopharyngeal(NP) swabs in vial transport medium  Result Value Ref Range Status   SARS Coronavirus 2 by RT PCR POSITIVE (A) NEGATIVE Final    Comment: RESULT CALLED TO, READ BACK BY AND VERIFIED WITH: WEST,S. RN AT 1056 10/14/20 MULLINS,T (NOTE) SARS-CoV-2 target nucleic acids are DETECTED.  The SARS-CoV-2 RNA is generally detectable in upper respiratory specimens during the acute phase of infection. Positive results are indicative of the presence of the identified virus, but do not rule out bacterial infection or co-infection with other pathogens not detected by the test. Clinical correlation with patient history and other diagnostic information is necessary to determine patient infection status. The expected result is Negative.  Fact Sheet for Patients: BloggerCourse.comhttps://www.fda.gov/media/152166/download  Fact Sheet for Healthcare Providers: SeriousBroker.ithttps://www.fda.gov/media/152162/download  This test is not yet approved or cleared by the [place] FDA and  has been authorized for detection and/or diagnosis of SARS-CoV-2 by FDA under an Emergency Use Authorization (EUA).  This EUA will remain in effect (meaning this test ca n be used) for the duration of  the COVID-19 declaration under Section 564(b)(1) of the Act, 21 U.S.C. section 360bbb-3(b)(1), unless the authorization is terminated or revoked sooner.     Influenza A by PCR NEGATIVE NEGATIVE Final   Influenza B by PCR NEGATIVE NEGATIVE Final    Comment: (NOTE) The Xpert Xpress SARS-CoV-2/FLU/RSV plus assay is intended as an aid in the diagnosis of influenza from Nasopharyngeal swab specimens and should not be used as a sole basis for treatment. Nasal washings and aspirates are unacceptable for Xpert Xpress SARS-CoV-2/FLU/RSV testing.  Fact Sheet for Patients: BloggerCourse.com  Fact  Sheet for Healthcare Providers: SeriousBroker.it  This test is not yet approved or cleared by the [place] FDA and has been authorized for detection and/or diagnosis of SARS-CoV-2 by FDA under an Emergency Use Authorization (EUA). This EUA will remain in effect (meaning this test can be used) for the duration of the COVID-19 declaration under Section 564(b)(1) of the Act, 21 U.S.C. section 360bbb-3(b)(1), unless the authorization is terminated or revoked.  Performed at [facility], [address], [place], [place] [address]      Labs: BNP (last 3 results) No results for input(s): BNP in the last 8760 hours. Basic Metabolic Panel: Recent Labs  Lab 10/14/20 0910 10/14/20 1354 10/14/20 1824 10/15/20 0346 10/16/20 0357  NA 141 138 135 138 135  K 2.9* 2.9* 3.1* 3.4* 3.9  CL 87* 94* 96* 99 102  CO2 35* 33* 30 33* 28  GLUCOSE 157* 129* 120* 102* 93  BUN 57* 43* 35* 29* 21*  CREATININE 1.73* 1.21 1.14 1.05 0.89  CALCIUM 9.4 8.7* 8.4* 8.4* 8.1*  MG 2.6*  --   --  2.2 1.8  PHOS 5.0*  --   --   --  2.4*   Liver Function Tests: Recent Labs  Lab 10/14/20 0910 10/15/20 0346 10/16/20 0357  AST 36 46* 42*  ALT 40 55* 65*  ALKPHOS 52 43 38  BILITOT 1.0 1.3* 1.6*  PROT 8.7* 6.3* 5.6*  ALBUMIN 4.9 3.7 3.2*   Recent Labs  Lab 10/14/20 0910  LIPASE 56*   No results for input(s): AMMONIA in the last 168 hours. CBC: Recent Labs  Lab 10/14/20 0910 10/15/20 0346 10/16/20 0357  WBC 13.7* 12.2* 7.5  NEUTROABS 11.6*  --   --   HGB 19.0* 15.2 13.8  HCT 54.3* 45.8 42.1  MCV 86.7 90.9 91.1  PLT 239 171 146*   Cardiac Enzymes: No results for input(s): CKTOTAL, CKMB, CKMBINDEX, TROPONINI in the last 168 hours. BNP: Invalid input(s): POCBNP CBG: No results for input(s): GLUCAP in the last 168 hours. D-Dimer No results for input(s): DDIMER in the last 72 hours. Hgb A1c No results for input(s): HGBA1C in the last 72  hours. Lipid Profile No results for input(s): CHOL, HDL, LDLCALC, TRIG, CHOLHDL, LDLDIRECT in the last 72 hours. Thyroid function studies No results for input(s): TSH, T4TOTAL, T3FREE, THYROIDAB in the last 72 hours.  Invalid input(s): FREET3 Anemia work up No results for input(s): VITAMINB12, FOLATE, FERRITIN, TIBC, IRON, RETICCTPCT in the last 72 hours. Urinalysis    Component Value Date/Time   COLORURINE YELLOW 10/14/2020 1401   APPEARANCEUR CLEAR 10/14/2020 1401   LABSPEC 1.018 10/14/2020 1401   PHURINE 8.0 10/14/2020 1401   GLUCOSEU NEGATIVE  10/14/2020 1401   HGBUR SMALL (A) 10/14/2020 1401   BILIRUBINUR NEGATIVE 10/14/2020 1401   KETONESUR NEGATIVE 10/14/2020 1401   PROTEINUR >=300 (A) 10/14/2020 1401   NITRITE NEGATIVE 10/14/2020 1401   LEUKOCYTESUR NEGATIVE 10/14/2020 1401   Sepsis Labs Invalid input(s): PROCALCITONIN,  WBC,  LACTICIDVEN Microbiology Recent Results (from the past 240 hour(s))  Resp Panel by RT-PCR (Flu A&B, Covid) Nasopharyngeal Swab     Status: Abnormal   Collection Time: 10/14/20  9:10 AM   Specimen: Nasopharyngeal Swab; Nasopharyngeal(NP) swabs in vial transport medium  Result Value Ref Range Status   SARS Coronavirus 2 by RT PCR POSITIVE (A) NEGATIVE Final    Comment: RESULT CALLED TO, READ BACK BY AND VERIFIED WITH: WEST,S. RN AT 1056 10/14/20 MULLINS,T (NOTE) SARS-CoV-2 target nucleic acids are DETECTED.  The SARS-CoV-2 RNA is generally detectable in upper respiratory specimens during the acute phase of infection. Positive results are indicative of the presence of the identified virus, but do not rule out bacterial infection or co-infection with other pathogens not detected by the test. Clinical correlation with patient history and other diagnostic information is necessary to determine patient infection status. The expected result is Negative.  Fact Sheet for Patients: BloggerCourse.com  Fact Sheet for Healthcare  Providers: SeriousBroker.it  This test is not yet approved or cleared by the [place] FDA and  has been authorized for detection and/or diagnosis of SARS-CoV-2 by FDA under an Emergency Use Authorization (EUA).  This EUA will remain in effect (meaning this test ca n be used) for the duration of  the COVID-19 declaration under Section 564(b)(1) of the Act, 21 U.S.C. section 360bbb-3(b)(1), unless the authorization is terminated or revoked sooner.     Influenza A by PCR NEGATIVE NEGATIVE Final   Influenza B by PCR NEGATIVE NEGATIVE Final    Comment: (NOTE) The Xpert Xpress SARS-CoV-2/FLU/RSV plus assay is intended as an aid in the diagnosis of influenza from Nasopharyngeal swab specimens and should not be used as a sole basis for treatment. Nasal washings and aspirates are unacceptable for Xpert Xpress SARS-CoV-2/FLU/RSV testing.  Fact Sheet for Patients: BloggerCourse.com  Fact Sheet for Healthcare Providers: SeriousBroker.it  This test is not yet approved or cleared by the [place] FDA and has been authorized for detection and/or diagnosis of SARS-CoV-2 by FDA under an Emergency Use Authorization (EUA). This EUA will remain in effect (meaning this test can be used) for the duration of the COVID-19 declaration under Section 564(b)(1) of the Act, 21 U.S.C. section 360bbb-3(b)(1), unless the authorization is terminated or revoked.  Performed at [facility], [address] [place], [place] [address]      Time coordinating discharge: Over 30 minutes  SIGNED:   Cipriano Bunker, MD  [facility] 10/16/2020, 11:54 AM Pager   If 7PM-7AM, please contact night-coverage [email or web address]

## 2020-10-16 NOTE — Plan of Care (Signed)
Pt to be discharged home with self care. Pt spouse to pick provide transportation via personal vehicle. Pt discharge education completed and pt verbalizes understanding and cooperation. All pt belongings taken with pt at discharge.   Problem: Education: Goal: Knowledge of risk factors and measures for prevention of condition will improve 10/16/2020 1041 by Junius Finner, RN Outcome: Adequate for Discharge 10/16/2020 1041 by Junius Finner, RN Outcome: Adequate for Discharge   Problem: Coping: Goal: Psychosocial and spiritual needs will be supported 10/16/2020 1041 by Junius Finner, RN Outcome: Adequate for Discharge 10/16/2020 1041 by Junius Finner, RN Outcome: Adequate for Discharge   Problem: Respiratory: Goal: Will maintain a patent airway 10/16/2020 1041 by Junius Finner, RN Outcome: Adequate for Discharge 10/16/2020 1041 by Junius Finner, RN Outcome: Adequate for Discharge Goal: Complications related to the disease process, condition or treatment will be avoided or minimized 10/16/2020 1041 by Junius Finner, RN Outcome: Adequate for Discharge 10/16/2020 1041 by Junius Finner, RN Outcome: Adequate for Discharge

## 2020-10-16 NOTE — Discharge Instructions (Signed)
Advised to follow-up with primary care physician in 1 week. Advised to take pantoprazole daily before breakfast for one month.

## 2021-12-22 IMAGING — DX DG CHEST 1V PORT
1 series · 1 of 1 positions shown · non-contrast
Comparison: 06/02/2016

CLINICAL DATA: [id number].  Coughing.  Epigastric abdominal pain.

EXAM:
PORTABLE CHEST 1 VIEW

[chest ap]
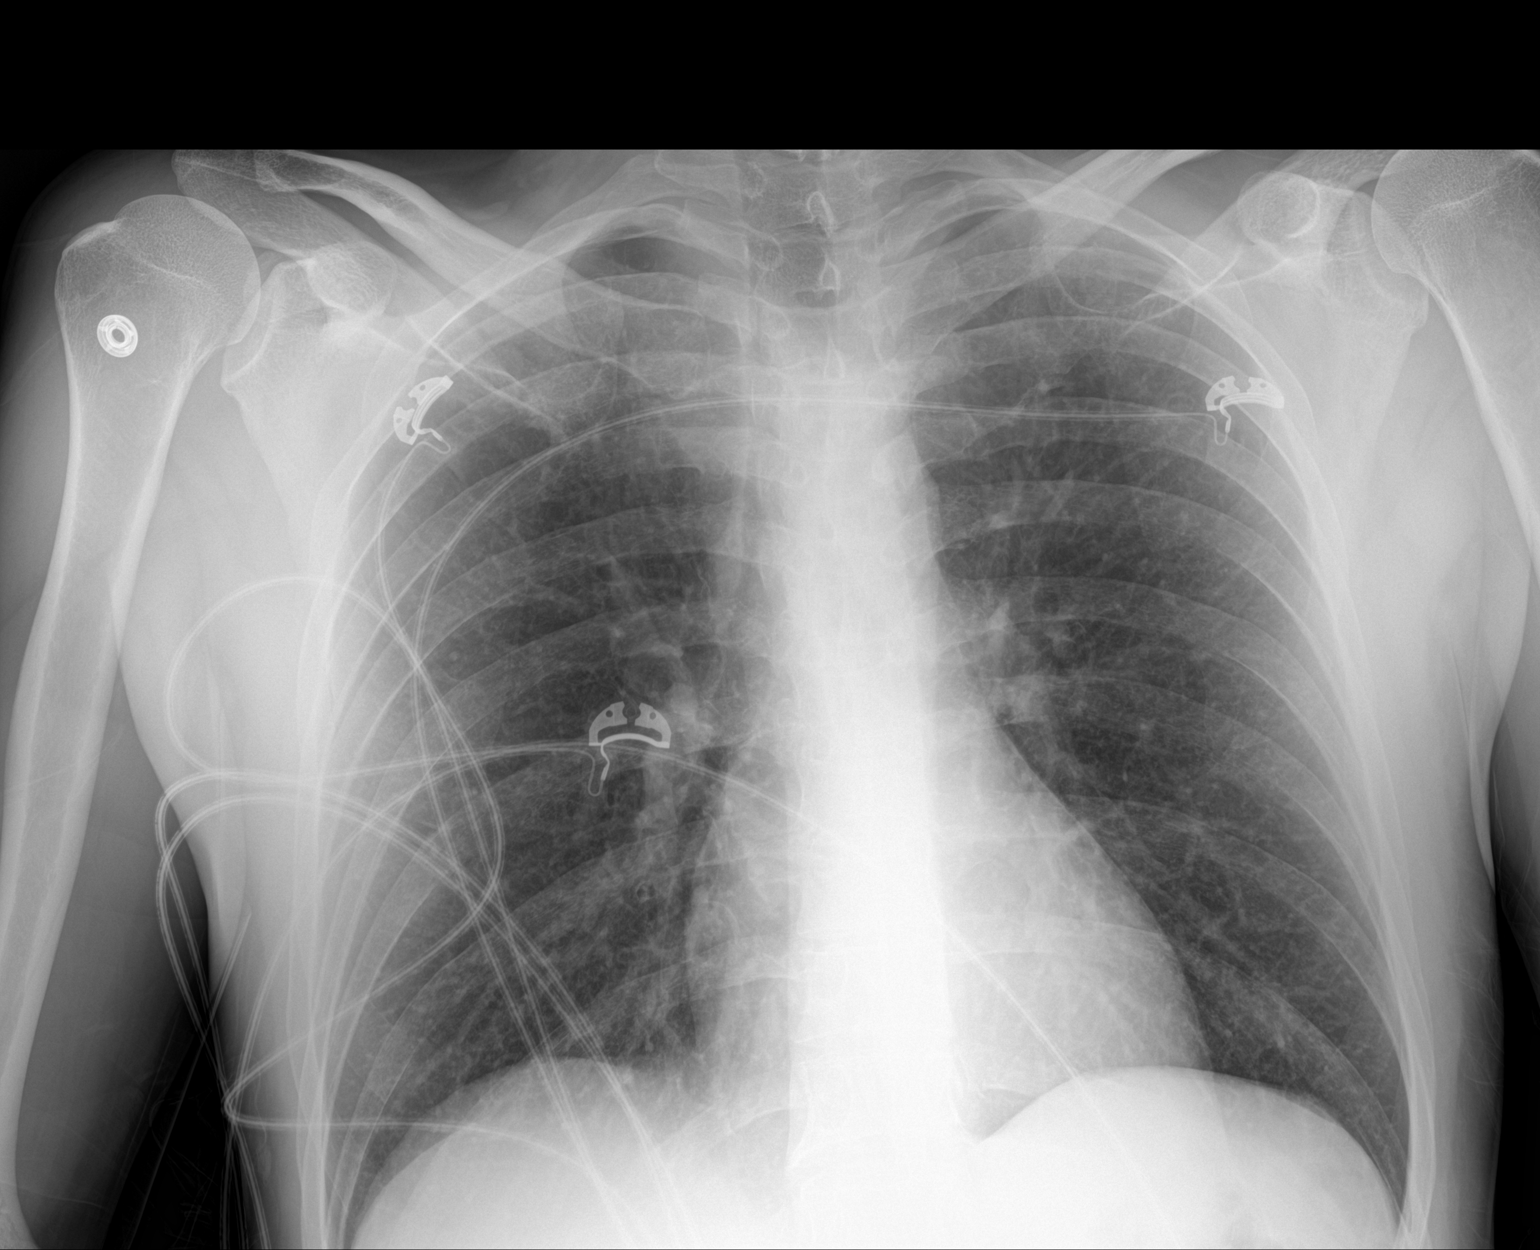

[1 of 1 positions shown; findings below may reference images not displayed]

FINDINGS: Numerous leads and wires project over the chest. Midline trachea.
Normal heart size and mediastinal contours. No pleural effusion or
pneumothorax. Hyperinflation. Clear lungs.
IMPRESSION: Hyperinflation, without acute disease.

## 2022-01-12 DIAGNOSIS — L0202 Furuncle of face: Secondary | ICD-10-CM | POA: Diagnosis not present

## 2023-07-10 ENCOUNTER — Ambulatory Visit: Payer: Self-pay | Admitting: Surgery

## 2023-07-10 NOTE — H&P (Signed)
 History of Present Illness: Logan Little is a 48 y.o. male who is seen today as an office consultation for evaluation of New Patient (Lipoma left hip )   He has had multiple subcutaneous nodules for years on all extremities. The one the primarily bothers him and is the largest is on the left upper thigh. He feels this one has grown in the last few years. It often hits things such as table edges when he is standing and working, and causes him discomfort. He would like to discuss removal. He also has multiple much smaller nodules on the right leg and both arms, which don't really bother him.   He is otherwise in good health and does not take any blood thinners.       Review of Systems: A complete review of systems was obtained from the patient.  I have reviewed this information and discussed as appropriate with the patient.  See HPI as well for other ROS.     Medical History: Past Medical History History reviewed. No pertinent past medical history.    Problem List Patient Active Problem List Diagnosis  AKI (acute kidney injury) ()  Dehydration  Elevated lactic acid level  Hypermagnesemia  Hypokalemia  Intractable vomiting with nausea  Tobacco abuse  COVID-19 virus infection      Past Surgical History History reviewed. No pertinent surgical history.     Allergies No Known Allergies    Medications Ordered Prior to Encounter No current outpatient medications on file prior to visit.    No current facility-administered medications on file prior to visit.      Family History History reviewed. No pertinent family history.     Tobacco Use History Social History    Tobacco Use Smoking Status Every Day  Types: Cigarettes Smokeless Tobacco Never      Social History Social History    Socioeconomic History  Marital status: Married Tobacco Use  Smoking status: Every Day     Types: Cigarettes  Smokeless tobacco: Never Vaping Use  Vaping status: Never  Used Substance and Sexual Activity  Alcohol use: Yes     Comment: very little  Drug use: Never    Social Drivers of Health    Housing Stability: Unknown (07/10/2023)   Housing Stability Vital Sign    Homeless in the Last Year: No      Objective:   Vitals:   07/10/23 1525 BP: 108/74 Pulse: 86 Temp: 36.7 C (98.1 F) SpO2: 98% Weight: 69.3 kg (152 lb 12.8 oz) Height: 167.6 cm (5\' 6" ) PainSc: 0-No pain   Body mass index is 24.66 kg/m.   Physical Exam Vitals reviewed.  Constitutional:      General: He is not in acute distress.    Appearance: Normal appearance.  HENT:     Head: Normocephalic and atraumatic.  Pulmonary:     Effort: Pulmonary effort is normal. No respiratory distress.  Skin:    Comments: Subcutaneous mass on the left upper lateral thigh, approximately 6cm in diameter and well-circumscribed, consistent with a benign lipoma.    3cm subcutaneous nodule on the left lateral lower thigh consistent with a lipoma.   2cm nodule on the right distal thigh consistent with a lipoma.   1-2cm nodules on the right forearm, consistent with lipomas.  Neurological:     General: No focal deficit present.     Mental Status: He is alert and oriented to person, place, and time.  Assessment and Plan:   Assessment Diagnoses and all orders for this visit:   Lipoma of left thigh   Lipoma of left upper extremity   Lipoma of right upper extremity   Lipoma of right thigh     48 yo male presenting with multiple extremity lipomas. The largest is on the left thigh, and causes discomfort due to the location and size. He would like to have this one removed. I feel this would best be done in the OR due to the size and depth. Will also excise an additional lipoma that is nearby on the same extremity. The remaining lipomas are small and asymptomatic. We will observe these for now, but if he would like to have them removed in the future, these would be amenable to  excision in the office with local anesthetic.   Patient will be contacted to schedule an elective surgery date. All questions were answered.   Sophronia Simas, MD [facility] Surgery General, Hepatobiliary and Pancreatic Surgery 07/10/23 3:50 PM

## 2023-08-03 ENCOUNTER — Ambulatory Visit: Admission: EM | Admit: 2023-08-03 | Discharge: 2023-08-03 | Disposition: A | Discharge status: Home / Self Care

## 2023-08-03 DIAGNOSIS — J069 Acute upper respiratory infection, unspecified: Secondary | ICD-10-CM

## 2023-08-03 LAB — POCT INFLUENZA A/B
Influenza A, POC: NEGATIVE
Influenza B, POC: NEGATIVE

## 2023-08-03 LAB — POC SARS CORONAVIRUS 2 AG -  ED: SARS Coronavirus 2 Ag: NEGATIVE

## 2023-08-03 NOTE — ED Triage Notes (Addendum)
"  For 3 days I have felt like I am going to dye, severe Fever (unknown degree), Sweats (more at night), body aches, cough, congestion, stuffy nose, not able to drink a lot/eat a lot". No seasonal flu covid19 vaccine.

## 2023-08-03 NOTE — ED Provider Notes (Signed)
 [facility]    CSN: [id number] Arrival date & time: 08/03/23  1113      History   Chief Complaint Chief Complaint  Patient presents with   Fever   Headache   Sweats (General)    HPI Logan Little is a 48 y.o. male.   Patient here today for evaluation of fever, headache, body aches and sweats that started a few days ago.  He reports he is feeling somewhat better today.  He reports some congestion and sinus pressure.  He has had some cough but denies any sore throat or ear pain.  He does not report any vomiting or diarrhea.  He has been taking Sudafed PE at home with mild relief.  The history is provided by the patient.  Fever Associated symptoms: chills, congestion, cough and headaches   Associated symptoms: no ear pain, no nausea, no sore throat and no vomiting   Headache Associated symptoms: congestion, cough and fever   Associated symptoms: no abdominal pain, no ear pain, no nausea, no sore throat and no vomiting     History reviewed. No pertinent past medical history.  Patient Active Problem List   Diagnosis Date Noted   Dehydration 10/14/2020   Intractable vomiting with nausea 10/14/2020   COVID-19 virus infection 10/14/2020   Hypokalemia 10/14/2020   AKI (acute kidney injury) (HCC) 10/14/2020   Hypermagnesemia 10/14/2020   Elevated lactic acid level 10/14/2020   Tobacco abuse 10/14/2020    Past Surgical History:  Procedure Laterality Date   CYST EXCISION         Home Medications    Prior to Admission medications   Medication Sig Start Date End Date Taking? Authorizing Provider  phenylephrine (SUDAFED PE) 10 MG TABS tablet Take 10 mg by mouth every 4 (four) hours as needed.   Yes [provider]  pantoprazole  (PROTONIX ) 40 MG tablet Take 1 tablet (40 mg total) by mouth daily. 10/16/20 10/16/21  Magdalene School, MD    Family History History reviewed. No pertinent family history.  Social History Social History   Tobacco Use    Smoking status: Every Day    Current packs/day: 1.00    Types: Cigarettes   Smokeless tobacco: Never  Vaping Use   Vaping status: Never Used  Substance Use Topics   Alcohol use: Yes    Comment: ocasionally   Drug use: Yes    Types: Marijuana    Comment: 2-3 days ago     Allergies   Patient has no known allergies.   Review of Systems Review of Systems  Constitutional:  Positive for chills, diaphoresis and fever.  HENT:  Positive for congestion. Negative for ear pain and sore throat.   Eyes:  Negative for discharge and redness.  Respiratory:  Positive for cough. Negative for shortness of breath.   Gastrointestinal:  Negative for abdominal pain, nausea and vomiting.  Neurological:  Positive for headaches.     Physical Exam Triage Vital Signs ED Triage Vitals  Encounter Vitals Group     BP 08/03/23 1123 107/67     Systolic BP Percentile --      Diastolic BP Percentile --      Pulse Rate 08/03/23 1123 99     Resp 08/03/23 1123 18     Temp 08/03/23 1123 99.2 F (37.3 C)     Temp Source 08/03/23 1123 Oral     SpO2 08/03/23 1123 98 %     Weight 08/03/23 1121 158 lb (71.7 kg)  Height 08/03/23 1121 5\' 6"  (1.676 m)     Head Circumference --      Peak Flow --      Pain Score 08/03/23 1119 5     Pain Loc --      Pain Education --      Exclude from Growth Chart --    No data found.  Updated Vital Signs BP 107/67 (BP Location: Left Arm)   Pulse 99   Temp 99.2 F (37.3 C) (Oral)   Resp 18   Ht 5\' 6"  (1.676 m)   Wt 158 lb (71.7 kg)   SpO2 98%   BMI 25.50 kg/m   Visual Acuity Right Eye Distance:   Left Eye Distance:   Bilateral Distance:    Right Eye Near:   Left Eye Near:    Bilateral Near:     Physical Exam Vitals and nursing note reviewed.  Constitutional:      General: He is not in acute distress.    Appearance: Normal appearance. He is not ill-appearing.  HENT:     Head: Normocephalic and atraumatic.     Right Ear: Tympanic membrane normal.      Left Ear: Tympanic membrane normal.     Nose: Congestion present.     Mouth/Throat:     Mouth: Mucous membranes are moist.     Pharynx: Oropharynx is clear. No oropharyngeal exudate or posterior oropharyngeal erythema.  Eyes:     Conjunctiva/sclera: Conjunctivae normal.  Cardiovascular:     Rate and Rhythm: Normal rate and regular rhythm.     Heart sounds: Normal heart sounds. No murmur heard. Pulmonary:     Effort: Pulmonary effort is normal. No respiratory distress.     Breath sounds: Normal breath sounds. No wheezing, rhonchi or rales.  Skin:    General: Skin is warm and dry.  Neurological:     Mental Status: He is alert.  Psychiatric:        Mood and Affect: Mood normal.        Thought Content: Thought content normal.      UC Treatments / Results  Labs (all labs ordered are listed, but only abnormal results are displayed) Labs Reviewed  POCT INFLUENZA A/B - Normal  POC SARS CORONAVIRUS 2 AG -  ED    EKG   Radiology No results found.  Procedures Procedures (including critical care time)  Medications Ordered in UC Medications - No data to display  Initial Impression / Assessment and Plan / UC Course  I have reviewed the triage vital signs and the nursing notes.  Pertinent labs & imaging results that were available during my care of the patient were reviewed by me and considered in my medical decision making (see chart for details).    Rapid flu and COVID screening negative in office.  Suspect most likely other viral etiology of upper respiratory infection and recommended symptomatic treatment, increase fluids and rest.  Encouraged follow-up if no gradual improvement or with any worsening symptoms.  Final Clinical Impressions(s) / UC Diagnoses   Final diagnoses:  Acute upper respiratory infection   Discharge Instructions   None    ED Prescriptions   None    PDMP not reviewed this encounter.   Vernestine Gondola, PA-C 08/03/23 1318

## 2023-08-05 ENCOUNTER — Telehealth: Payer: Self-pay | Admitting: Emergency Medicine

## 2023-08-05 NOTE — Telephone Encounter (Signed)
 Phone Message ... MRN [id number] Logan Little was seen Sunday, claims all the test ran didn't show anything was wrong with him but he still wasn't feeling well. Also wanted to know would carbon monoxide exposure affect anything testing done.  he was calling to speak with Ivette Marks but if anyone could possible call and explain #[phone].   Spoke with pt concerning this matter. Pt states most sxs are gone today and he feels much better. Pt was exposed to Carbon Monoxide at work. Pt will follow up with his boss.
